# Patient Record
Sex: Female | Born: 1980 | Race: Black or African American | Hispanic: No | Marital: Single | Smoking: Former smoker
Health system: Southern US, Community
[De-identification: ages and names within clinical notes are randomized; demographics above are authoritative.]

## PROBLEM LIST (undated history)

## (undated) ENCOUNTER — Inpatient Hospital Stay (HOSPITAL_COMMUNITY): Payer: Self-pay

## (undated) DIAGNOSIS — I10 Essential (primary) hypertension: Secondary | ICD-10-CM

## (undated) DIAGNOSIS — Z8619 Personal history of other infectious and parasitic diseases: Secondary | ICD-10-CM

## (undated) DIAGNOSIS — Z3400 Encounter for supervision of normal first pregnancy, unspecified trimester: Secondary | ICD-10-CM

## (undated) DIAGNOSIS — B999 Unspecified infectious disease: Secondary | ICD-10-CM

## (undated) HISTORY — PX: INNER EAR SURGERY: SHX679

## (undated) HISTORY — DX: Personal history of other infectious and parasitic diseases: Z86.19

---

## 1999-06-06 ENCOUNTER — Emergency Department (HOSPITAL_COMMUNITY): Admission: EM | Admit: 1999-06-06 | Discharge: 1999-06-06 | Payer: Self-pay

## 1999-10-03 ENCOUNTER — Emergency Department (HOSPITAL_COMMUNITY): Admission: EM | Admit: 1999-10-03 | Discharge: 1999-10-03 | Payer: Self-pay | Admitting: Emergency Medicine

## 2000-04-18 ENCOUNTER — Emergency Department (HOSPITAL_COMMUNITY): Admission: EM | Admit: 2000-04-18 | Discharge: 2000-04-18 | Payer: Self-pay | Admitting: *Deleted

## 2006-08-28 ENCOUNTER — Emergency Department (HOSPITAL_COMMUNITY): Admission: EM | Admit: 2006-08-28 | Discharge: 2006-08-28 | Payer: Self-pay | Admitting: Emergency Medicine

## 2007-01-30 ENCOUNTER — Encounter: Admission: RE | Admit: 2007-01-30 | Discharge: 2007-01-30 | Payer: Self-pay | Admitting: Internal Medicine

## 2010-01-23 ENCOUNTER — Encounter: Payer: Self-pay | Admitting: Obstetrics & Gynecology

## 2010-01-23 ENCOUNTER — Encounter: Payer: Self-pay | Admitting: *Deleted

## 2010-04-16 ENCOUNTER — Emergency Department (HOSPITAL_COMMUNITY)
Admission: EM | Admit: 2010-04-16 | Discharge: 2010-04-16 | Disposition: A | Discharge status: Home / Self Care | Payer: Self-pay | Attending: Emergency Medicine | Admitting: Emergency Medicine

## 2010-04-16 ENCOUNTER — Emergency Department (HOSPITAL_COMMUNITY): Payer: Self-pay

## 2010-04-16 DIAGNOSIS — M7989 Other specified soft tissue disorders: Secondary | ICD-10-CM | POA: Insufficient documentation

## 2010-04-16 DIAGNOSIS — M79609 Pain in unspecified limb: Secondary | ICD-10-CM | POA: Insufficient documentation

## 2011-08-04 LAB — OB RESULTS CONSOLE ABO/RH: RH Type: POSITIVE

## 2011-08-04 LAB — OB RESULTS CONSOLE HEPATITIS B SURFACE ANTIGEN: Hepatitis B Surface Ag: NEGATIVE

## 2012-01-03 NOTE — L&D Delivery Note (Signed)
Delivery Note After 1 1/2 hr of pushing, with questionable and inconsistent  effort for delivery.  At 10:47 AM a viable and healthy female was delivered via Vaginal, Spontaneous Delivery (Presentation: LOA ; LOT).  APGAR: 8, 9 ; weight P.   Placenta status: delivered, intact .  Cord: 3VC with the following complications: none.   Anesthesia: Epidural  Episiotomy: none Lacerations: labial/periurethral on right Suture Repair: 3.0 vicryl rapide Est. Blood Loss (mL): 400cc  Mom to postpartum.  Baby to stay with mom and extended family.  BOVARD,Demont Linford 01/25/2012, 11:02 AM  O+/ Br/ RNI/ Contra - POPs

## 2012-01-07 ENCOUNTER — Encounter (HOSPITAL_COMMUNITY): Payer: Self-pay | Admitting: *Deleted

## 2012-01-07 ENCOUNTER — Inpatient Hospital Stay (HOSPITAL_COMMUNITY)
Admission: AD | Admit: 2012-01-07 | Discharge: 2012-01-07 | Disposition: A | Discharge status: Home / Self Care | Payer: Medicaid Other | Source: Ambulatory Visit | Attending: Obstetrics and Gynecology | Admitting: Obstetrics and Gynecology

## 2012-01-07 DIAGNOSIS — O99891 Other specified diseases and conditions complicating pregnancy: Secondary | ICD-10-CM | POA: Insufficient documentation

## 2012-01-07 LAB — POCT FERN TEST: POCT Fern Test: NEGATIVE

## 2012-01-07 NOTE — Progress Notes (Signed)
Dr Jackelyn Knife notified of pt's complaints of leakage of fluid  And fern results, orders received

## 2012-01-07 NOTE — Discharge Instructions (Signed)
Pregnancy - Third Trimester  The third trimester of pregnancy (the last 3 months) is a period of the most rapid growth for you and your baby. The baby approaches a length of 20 inches and a weight of 6 to 10 pounds. The baby is adding on fat and getting ready for life outside your body. While inside, babies have periods of sleeping and waking, suck their thumbs, and hiccups. You can often feel small contractions of the uterus. This is false labor. It is also called Braxton-Hicks contractions. This is like a practice for labor. The usual problems in this stage of pregnancy include more difficulty breathing, swelling of the hands and feet from water retention, and having to urinate more often because of the uterus and baby pressing on your bladder.   PRENATAL EXAMS  · Blood work may continue to be done during prenatal exams. These tests are done to check on your health and the probable health of your baby. Blood work is used to follow your blood levels (hemoglobin). Anemia (low hemoglobin) is common during pregnancy. Iron and vitamins are given to help prevent this. You may also continue to be checked for diabetes. Some of the past blood tests may be done again.  · The size of the uterus is measured during each visit. This makes sure your baby is growing properly according to your pregnancy dates.  · Your blood pressure is checked every prenatal visit. This is to make sure you are not getting toxemia.  · Your urine is checked every prenatal visit for infection, diabetes and protein.  · Your weight is checked at each visit. This is done to make sure gains are happening at the suggested rate and that you and your baby are growing normally.  · Sometimes, an ultrasound is performed to confirm the position and the proper growth and development of the baby. This is a test done that bounces harmless sound waves off the baby so your caregiver can more accurately determine due dates.  · Discuss the type of pain medication and  anesthesia you will have during your labor and delivery.  · Discuss the possibility and anesthesia if a Cesarean Section might be necessary.  · Inform your caregiver if there is any mental or physical violence at home.  Sometimes, a specialized non-stress test, contraction stress test and biophysical profile are done to make sure the baby is not having a problem. Checking the amniotic fluid surrounding the baby is called an amniocentesis. The amniotic fluid is removed by sticking a needle into the belly (abdomen). This is sometimes done near the end of pregnancy if an early delivery is required. In this case, it is done to help make sure the baby's lungs are mature enough for the baby to live outside of the womb. If the lungs are not mature and it is unsafe to deliver the baby, an injection of cortisone medication is given to the mother 1 to 2 days before the delivery. This helps the baby's lungs mature and makes it safer to deliver the baby.  CHANGES OCCURING IN THE THIRD TRIMESTER OF PREGNANCY  Your body goes through many changes during pregnancy. They vary from person to person. Talk to your caregiver about changes you notice and are concerned about.  · During the last trimester, you have probably had an increase in your appetite. It is normal to have cravings for certain foods. This varies from person to person and pregnancy to pregnancy.  · You may begin to   get stretch marks on your hips, abdomen, and breasts. These are normal changes in the body during pregnancy. There are no exercises or medications to take which prevent this change.  · Constipation may be treated with a stool softener or adding bulk to your diet. Drinking lots of fluids, fiber in vegetables, fruits, and whole grains are helpful.  · Exercising is also helpful. If you have been very active up until your pregnancy, most of these activities can be continued during your pregnancy. If you have been less active, it is helpful to start an exercise  program such as walking. Consult your caregiver before starting exercise programs.  · Avoid all smoking, alcohol, un-prescribed drugs, herbs and "street drugs" during your pregnancy. These chemicals affect the formation and growth of the baby. Avoid chemicals throughout the pregnancy to ensure the delivery of a healthy infant.  · Backache, varicose veins and hemorrhoids may develop or get worse.  · You will tire more easily in the third trimester, which is normal.  · The baby's movements may be stronger and more often.  · You may become short of breath easily.  · Your belly button may stick out.  · A yellow discharge may leak from your breasts called colostrum.  · You may have a bloody mucus discharge. This usually occurs a few days to a week before labor begins.  HOME CARE INSTRUCTIONS   · Keep your caregiver's appointments. Follow your caregiver's instructions regarding medication use, exercise, and diet.  · During pregnancy, you are providing food for you and your baby. Continue to eat regular, well-balanced meals. Choose foods such as meat, fish, milk and other low fat dairy products, vegetables, fruits, and whole-grain breads and cereals. Your caregiver will tell you of the ideal weight gain.  · A physical sexual relationship may be continued throughout pregnancy if there are no other problems such as early (premature) leaking of amniotic fluid from the membranes, vaginal bleeding, or belly (abdominal) pain.  · Exercise regularly if there are no restrictions. Check with your caregiver if you are unsure of the safety of your exercises. Greater weight gain will occur in the last 2 trimesters of pregnancy. Exercising helps:  · Control your weight.  · Get you in shape for labor and delivery.  · You lose weight after you deliver.  · Rest a lot with legs elevated, or as needed for leg cramps or low back pain.  · Wear a good support or jogging bra for breast tenderness during pregnancy. This may help if worn during  sleep. Pads or tissues may be used in the bra if you are leaking colostrum.  · Do not use hot tubs, steam rooms, or saunas.  · Wear your seat belt when driving. This protects you and your baby if you are in an accident.  · Avoid raw meat, cat litter boxes and soil used by cats. These carry germs that can cause birth defects in the baby.  · It is easier to loose urine during pregnancy. Tightening up and strengthening the pelvic muscles will help with this problem. You can practice stopping your urination while you are going to the bathroom. These are the same muscles you need to strengthen. It is also the muscles you would use if you were trying to stop from passing gas. You can practice tightening these muscles up 10 times a set and repeating this about 3 times per day. Once you know what muscles to tighten up, do not perform these   exercises during urination. It is more likely to cause an infection by backing up the urine.  · Ask for help if you have financial, counseling or nutritional needs during pregnancy. Your caregiver will be able to offer counseling for these needs as well as refer you for other special needs.  · Make a list of emergency phone numbers and have them available.  · Plan on getting help from family or friends when you go home from the hospital.  · Make a trial run to the hospital.  · Take prenatal classes with the father to understand, practice and ask questions about the labor and delivery.  · Prepare the baby's room/nursery.  · Do not travel out of the city unless it is absolutely necessary and with the advice of your caregiver.  · Wear only low or no heal shoes to have better balance and prevent falling.  MEDICATIONS AND DRUG USE IN PREGNANCY  · Take prenatal vitamins as directed. The vitamin should contain 1 milligram of folic acid. Keep all vitamins out of reach of children. Only a couple vitamins or tablets containing iron may be fatal to a baby or young child when ingested.  · Avoid use  of all medications, including herbs, over-the-counter medications, not prescribed or suggested by your caregiver. Only take over-the-counter or prescription medicines for pain, discomfort, or fever as directed by your caregiver. Do not use aspirin, ibuprofen (Motrin®, Advil®, Nuprin®) or naproxen (Aleve®) unless OK'd by your caregiver.  · Let your caregiver also know about herbs you may be using.  · Alcohol is related to a number of birth defects. This includes fetal alcohol syndrome. All alcohol, in any form, should be avoided completely. Smoking will cause low birth rate and premature babies.  · Street/illegal drugs are very harmful to the baby. They are absolutely forbidden. A baby born to an addicted mother will be addicted at birth. The baby will go through the same withdrawal an adult does.  SEEK MEDICAL CARE IF:  You have any concerns or worries during your pregnancy. It is better to call with your questions if you feel they cannot wait, rather than worry about them.  DECISIONS ABOUT CIRCUMCISION  You may or may not know the sex of your baby. If you know your baby is a boy, it may be time to think about circumcision. Circumcision is the removal of the foreskin of the penis. This is the skin that covers the sensitive end of the penis. There is no proven medical need for this. Often this decision is made on what is popular at the time or based upon religious beliefs and social issues. You can discuss these issues with your caregiver or pediatrician.  SEEK IMMEDIATE MEDICAL CARE IF:   · An unexplained oral temperature above 102° F (38.9° C) develops, or as your caregiver suggests.  · You have leaking of fluid from the vagina (birth canal). If leaking membranes are suspected, take your temperature and tell your caregiver of this when you call.  · There is vaginal spotting, bleeding or passing clots. Tell your caregiver of the amount and how many pads are used.  · You develop a bad smelling vaginal discharge with  a change in the color from clear to white.  · You develop vomiting that lasts more than 24 hours.  · You develop chills or fever.  · You develop shortness of breath.  · You develop burning on urination.  · You loose more than 2 pounds of weight   or gain more than 2 pounds of weight or as suggested by your caregiver.  · You notice sudden swelling of your face, hands, and feet or legs.  · You develop belly (abdominal) pain. Round ligament discomfort is a common non-cancerous (benign) cause of abdominal pain in pregnancy. Your caregiver still must evaluate you.  · You develop a severe headache that does not go away.  · You develop visual problems, blurred or double vision.  · If you have not felt your baby move for more than 1 hour. If you think the baby is not moving as much as usual, eat something with sugar in it and lie down on your left side for an hour. The baby should move at least 4 to 5 times per hour. Call right away if your baby moves less than that.  · You fall, are in a car accident or any kind of trauma.  · There is mental or physical violence at home.  Document Released: 12/13/2000 Document Revised: 03/13/2011 Document Reviewed: 06/17/2008  Exit[employer]® Patient Information ©2013 [employer].

## 2012-01-07 NOTE — MAU Provider Note (Signed)
Speculum exam done for evaluation of ROM.  Client reports wet underwear upon awakening today and periodic leaking through the day.  Speculum exam:  No pooling, no leaking seen with valsalva.  Fern slide done but suspect it will be negative as client has small amount of creamy vaginal discharge but no evidence of ROM on speculum exam.

## 2012-01-07 NOTE — MAU Note (Signed)
Pt states that she noticed that her underwear was staying damp since this morning around 10am. Denies any bleeding but does state some cramping since she noticed the leakage this morning

## 2012-01-10 LAB — OB RESULTS CONSOLE GBS: GBS: NEGATIVE

## 2012-01-25 ENCOUNTER — Encounter (HOSPITAL_COMMUNITY): Payer: Self-pay | Admitting: Anesthesiology

## 2012-01-25 ENCOUNTER — Inpatient Hospital Stay (HOSPITAL_COMMUNITY): Payer: Medicaid Other | Admitting: Anesthesiology

## 2012-01-25 ENCOUNTER — Encounter (HOSPITAL_COMMUNITY): Payer: Self-pay

## 2012-01-25 ENCOUNTER — Inpatient Hospital Stay (HOSPITAL_COMMUNITY)
Admission: AD | Admit: 2012-01-25 | Discharge: 2012-01-27 | DRG: 775 | Disposition: A | Discharge status: Home / Self Care | Payer: Medicaid Other | Source: Ambulatory Visit | Attending: Obstetrics and Gynecology | Admitting: Obstetrics and Gynecology

## 2012-01-25 DIAGNOSIS — Z3400 Encounter for supervision of normal first pregnancy, unspecified trimester: Secondary | ICD-10-CM

## 2012-01-25 DIAGNOSIS — O4290 Premature rupture of membranes, unspecified as to length of time between rupture and onset of labor, unspecified weeks of gestation: Secondary | ICD-10-CM | POA: Diagnosis present

## 2012-01-25 DIAGNOSIS — O9902 Anemia complicating childbirth: Secondary | ICD-10-CM | POA: Diagnosis present

## 2012-01-25 DIAGNOSIS — D573 Sickle-cell trait: Secondary | ICD-10-CM | POA: Diagnosis present

## 2012-01-25 HISTORY — DX: Encounter for supervision of normal first pregnancy, unspecified trimester: Z34.00

## 2012-01-25 LAB — URINALYSIS, ROUTINE W REFLEX MICROSCOPIC
Ketones, ur: NEGATIVE mg/dL
Leukocytes, UA: NEGATIVE
Nitrite: NEGATIVE
Protein, ur: NEGATIVE mg/dL
Urobilinogen, UA: 0.2 mg/dL (ref 0.0–1.0)
pH: 7 (ref 5.0–8.0)

## 2012-01-25 LAB — URINE MICROSCOPIC-ADD ON

## 2012-01-25 LAB — ABO/RH: ABO/RH(D): O POS

## 2012-01-25 LAB — COMPREHENSIVE METABOLIC PANEL
Albumin: 2.6 g/dL — ABNORMAL LOW (ref 3.5–5.2)
BUN: 5 mg/dL — ABNORMAL LOW (ref 6–23)
Calcium: 8.7 mg/dL (ref 8.4–10.5)
Creatinine, Ser: 0.72 mg/dL (ref 0.50–1.10)
GFR calc Af Amer: >90 mL/min (ref >90)
Glucose, Bld: 81 mg/dL (ref 70–99)
Total Protein: 6.5 g/dL (ref 6.0–8.3)

## 2012-01-25 LAB — TYPE AND SCREEN: ABO/RH(D): O POS

## 2012-01-25 LAB — CBC
Hemoglobin: 10.6 g/dL — ABNORMAL LOW (ref 12.0–15.0)
MCHC: 32.3 g/dL (ref 30.0–36.0)
Platelets: 200 10*3/uL (ref 150–400)
RDW: 13.4 % (ref 11.5–15.5)

## 2012-01-25 LAB — LACTATE DEHYDROGENASE: LDH: 196 U/L (ref 94–250)

## 2012-01-25 LAB — RPR: RPR Ser Ql: NONREACTIVE

## 2012-01-25 LAB — POCT FERN TEST: POCT Fern Test: POSITIVE

## 2012-01-25 MED ORDER — FLEET ENEMA 7-19 GM/118ML RE ENEM: 1.0000 | ENEMA | RECTAL | Status: DC | PRN

## 2012-01-25 MED ORDER — PHENYLEPHRINE 40 MCG/ML (10ML) SYRINGE FOR IV PUSH (FOR BLOOD PRESSURE SUPPORT)
80.0000 ug | PREFILLED_SYRINGE | INTRAVENOUS | Status: DC | PRN
  Filled 2012-01-25: qty 5

## 2012-01-25 MED ORDER — ONDANSETRON HCL 4 MG PO TABS: 4.0000 mg | ORAL_TABLET | ORAL | Status: DC | PRN

## 2012-01-25 MED ORDER — LIDOCAINE HCL (PF) 1 % IJ SOLN
INTRAMUSCULAR | Status: DC | PRN
  Administered 2012-01-25 (×2): 5 mL

## 2012-01-25 MED ORDER — IBUPROFEN 600 MG PO TABS
600.0000 mg | ORAL_TABLET | Freq: Four times a day (QID) | ORAL | Status: DC
  Administered 2012-01-25 – 2012-01-27 (×9): 600 mg via ORAL
  Filled 2012-01-25 (×9): qty 1

## 2012-01-25 MED ORDER — PRENATAL MULTIVITAMIN CH: 1.0000 | ORAL_TABLET | Freq: Every day | ORAL | Status: DC

## 2012-01-25 MED ORDER — ONDANSETRON HCL 4 MG/2ML IJ SOLN: 4.0000 mg | Freq: Four times a day (QID) | INTRAMUSCULAR | Status: DC | PRN

## 2012-01-25 MED ORDER — BENZOCAINE-MENTHOL 20-0.5 % EX AERO: 1.0000 "application " | INHALATION_SPRAY | CUTANEOUS | Status: DC | PRN

## 2012-01-25 MED ORDER — EPHEDRINE 5 MG/ML INJ: 10.0000 mg | INTRAVENOUS | Status: DC | PRN

## 2012-01-25 MED ORDER — ZOLPIDEM TARTRATE 5 MG PO TABS: 5.0000 mg | ORAL_TABLET | Freq: Every evening | ORAL | Status: DC | PRN

## 2012-01-25 MED ORDER — LACTATED RINGERS IV SOLN
INTRAVENOUS | Status: DC
  Administered 2012-01-25: 06:00:00 via INTRAVENOUS

## 2012-01-25 MED ORDER — WITCH HAZEL-GLYCERIN EX PADS: 1.0000 "application " | MEDICATED_PAD | CUTANEOUS | Status: DC | PRN

## 2012-01-25 MED ORDER — ACETAMINOPHEN 325 MG PO TABS: 650.0000 mg | ORAL_TABLET | ORAL | Status: DC | PRN

## 2012-01-25 MED ORDER — LANOLIN HYDROUS EX OINT: TOPICAL_OINTMENT | CUTANEOUS | Status: DC | PRN

## 2012-01-25 MED ORDER — OXYCODONE-ACETAMINOPHEN 5-325 MG PO TABS
1.0000 | ORAL_TABLET | ORAL | Status: DC | PRN
  Administered 2012-01-26: 1 via ORAL
  Filled 2012-01-25: qty 1

## 2012-01-25 MED ORDER — OXYCODONE-ACETAMINOPHEN 5-325 MG PO TABS: 1.0000 | ORAL_TABLET | ORAL | Status: DC | PRN

## 2012-01-25 MED ORDER — OXYTOCIN 40 UNITS IN LACTATED RINGERS INFUSION - SIMPLE MED: 1.0000 m[IU]/min | INTRAVENOUS | Status: DC

## 2012-01-25 MED ORDER — LACTATED RINGERS IV SOLN: 500.0000 mL | INTRAVENOUS | Status: DC | PRN

## 2012-01-25 MED ORDER — LACTATED RINGERS IV SOLN
500.0000 mL | Freq: Once | INTRAVENOUS | Status: AC
  Administered 2012-01-25: 500 mL via INTRAVENOUS

## 2012-01-25 MED ORDER — PHENYLEPHRINE 40 MCG/ML (10ML) SYRINGE FOR IV PUSH (FOR BLOOD PRESSURE SUPPORT): 80.0000 ug | PREFILLED_SYRINGE | INTRAVENOUS | Status: DC | PRN

## 2012-01-25 MED ORDER — OXYTOCIN 40 UNITS IN LACTATED RINGERS INFUSION - SIMPLE MED
62.5000 mL/h | INTRAVENOUS | Status: DC
  Administered 2012-01-25: 62.5 mL/h via INTRAVENOUS
  Filled 2012-01-25: qty 1000

## 2012-01-25 MED ORDER — TERBUTALINE SULFATE 1 MG/ML IJ SOLN: 0.2500 mg | Freq: Once | INTRAMUSCULAR | Status: DC | PRN

## 2012-01-25 MED ORDER — SENNOSIDES-DOCUSATE SODIUM 8.6-50 MG PO TABS
2.0000 | ORAL_TABLET | Freq: Every day | ORAL | Status: DC
  Administered 2012-01-25 – 2012-01-26 (×2): 2 via ORAL

## 2012-01-25 MED ORDER — CITRIC ACID-SODIUM CITRATE 334-500 MG/5ML PO SOLN: 30.0000 mL | ORAL | Status: DC | PRN

## 2012-01-25 MED ORDER — BUTORPHANOL TARTRATE 1 MG/ML IJ SOLN
1.0000 mg | Freq: Once | INTRAMUSCULAR | Status: AC
  Administered 2012-01-25: 1 mg via INTRAVENOUS
  Filled 2012-01-25: qty 1

## 2012-01-25 MED ORDER — OXYTOCIN BOLUS FROM INFUSION: 500.0000 mL | INTRAVENOUS | Status: DC

## 2012-01-25 MED ORDER — LACTATED RINGERS IV SOLN: INTRAVENOUS | Status: DC

## 2012-01-25 MED ORDER — DIPHENHYDRAMINE HCL 50 MG/ML IJ SOLN: 12.5000 mg | INTRAMUSCULAR | Status: DC | PRN

## 2012-01-25 MED ORDER — IBUPROFEN 600 MG PO TABS: 600.0000 mg | ORAL_TABLET | Freq: Four times a day (QID) | ORAL | Status: DC | PRN

## 2012-01-25 MED ORDER — TETANUS-DIPHTH-ACELL PERTUSSIS 5-2.5-18.5 LF-MCG/0.5 IM SUSP: 0.5000 mL | Freq: Once | INTRAMUSCULAR | Status: DC

## 2012-01-25 MED ORDER — EPHEDRINE 5 MG/ML INJ
10.0000 mg | INTRAVENOUS | Status: DC | PRN
  Filled 2012-01-25: qty 4

## 2012-01-25 MED ORDER — SIMETHICONE 80 MG PO CHEW: 80.0000 mg | CHEWABLE_TABLET | ORAL | Status: DC | PRN

## 2012-01-25 MED ORDER — ONDANSETRON HCL 4 MG/2ML IJ SOLN: 4.0000 mg | INTRAMUSCULAR | Status: DC | PRN

## 2012-01-25 MED ORDER — FENTANYL 2.5 MCG/ML BUPIVACAINE 1/10 % EPIDURAL INFUSION (WH - ANES)
14.0000 mL/h | INTRAMUSCULAR | Status: DC
  Administered 2012-01-25: 14 mL/h via EPIDURAL
  Filled 2012-01-25: qty 125

## 2012-01-25 MED ORDER — DIPHENHYDRAMINE HCL 25 MG PO CAPS: 25.0000 mg | ORAL_CAPSULE | Freq: Four times a day (QID) | ORAL | Status: DC | PRN

## 2012-01-25 MED ORDER — PRENATAL MULTIVITAMIN CH
1.0000 | ORAL_TABLET | Freq: Every day | ORAL | Status: DC
  Administered 2012-01-25 – 2012-01-27 (×3): 1 via ORAL
  Filled 2012-01-25 (×3): qty 1

## 2012-01-25 MED ORDER — LIDOCAINE HCL (PF) 1 % IJ SOLN
30.0000 mL | INTRAMUSCULAR | Status: DC | PRN
  Filled 2012-01-25: qty 30

## 2012-01-25 MED ORDER — DIBUCAINE 1 % RE OINT: 1.0000 "application " | TOPICAL_OINTMENT | RECTAL | Status: DC | PRN

## 2012-01-25 NOTE — MAU Note (Signed)
Pt reports ROM at 0145, some discomfort

## 2012-01-25 NOTE — Progress Notes (Signed)
Patient ID: Diana Flynn, female   DOB: [birth year], 32 y.o.   MRN: [id number]  CTSP secondary to 5 min decel.  Pt comfortable with epidural  AFVSS gen NAD FHR 125, min/mod var, had variability during decel toco irr  Pit off, will let baby rest and restart slowly after 30 min break Rapid cervical change 5 - 7 cm  Continue close monitoring

## 2012-01-25 NOTE — H&P (Signed)
Diana Flynn, Diana Flynn NO.:  [id number]  MEDICAL RECORD NO.:  [id number]  LOCATION:  9165                          FACILITY:  [facility]  PHYSICIAN:  Malachi Pro. Ambrose Mantle, M.D. DATE OF BIRTH:  [birth year]  DATE OF ADMISSION:  01/25/2012 DATE OF DISCHARGE:                             HISTORY & PHYSICAL   PRESENT ILLNESS:  This is a 32 year old black female, para 0-0-2-0, gravida 3 with [facility] February 01, 2012 who is admitted with premature rupture of the membranes.  The rupture of membranes occurred at 1:45 a.m. on January 25, 2012.  The patient got up to go to the restroom and noted fluid running down her leg and was convinced that it was not urine.  She came to the emergency room, was found to have ruptured membranes and was admitted.  Blood group and type O positive.  Negative antibody.  Pap smear normal.  Rubella equivocal.  RPR nonreactive. Urine culture negative.  Hepatitis B surface antigen negative.  HIV negative.  Urine drug screen negative.  Hemoglobin electrophoresis AS. GC and Chlamydia negative.  Cystic fibrosis screen negative.  First trimester screen negative.  AFP negative.  One hour Glucola 96 group B strep negative.  The patient's prenatal course was essentially uncomplicated.  She did have a history of cocaine use.  She has sickle cell trait.  Father of baby states he is not a carrier and the patient smoked, she did receive the Tdap and the flu vaccine and she is group B strep negative.  She did have some difficulty with vision in her left eye at 29 weeks.  This apparently resolved.  Her blood pressure was always normal at her prenatal visits, but they were high-normal on January 8th and January 16th at 128/88.  While in the maternity admission unit, her blood pressure was slightly elevated.  PIH labs were drawn which were normal.  The urine specimen has not been returned. While in the maternity admission unit, she was noted to have a deceleration that lasted  for 4 minutes, but with proper positioning the heart rate returned to normal and has remained normal.  PAST MEDICAL HISTORY:  Reveals that she has had 2 therapeutic abortions. She has had a history of Chlamydia and gonorrhea.  SURGICAL HISTORY:  She has had surgical procedures, two terminations of pregnancy.  ALLERGIES:  She has no known drug allergies.  No latex allergies.  FAMILY HISTORY:  Mom has diabetes.  No other close family history.  OBSTETRIC HISTORY:  Two terminations of pregnancy.  The patient states that since she has been pregnant she has not used alcohol or cocaine. She was smoking cigarettes at the time of the onset of pregnancy.  PHYSICAL EXAMINATION:  VITAL SIGNS:  On admission, her blood pressure is 127/88, temperature 98.4, heart rate 89, pulse 18. HEART:  Normal size and sounds.  No murmurs. LUNGS:  Clear to auscultation. ABDOMEN:  Soft, nontender.  Appropriate for gestational age. PELVIC:  Per the admitting nurse the cervix was 2 cm, 70%, vertex at -2. Ruptured membranes was confirmed.  ADMITTING IMPRESSION:  Intrauterine pregnancy at 39 weeks with premature rupture of membranes, history of cocaine abuse.  The patient has been started on Pitocin.  She is not contracting regularly, but the fetal heart rate has remained normal.  She will be watched closely and observed for progress of labor.     Malachi Pro. Ambrose Mantle, M.D.     TFH/MEDQ  D:  01/25/2012  T:  01/25/2012  Job:  [id number]

## 2012-01-25 NOTE — Anesthesia Preprocedure Evaluation (Signed)

## 2012-01-25 NOTE — MAU Note (Signed)
FHR down into 80's for 4 minutes. IV started, labs drawn. 10L O2 started and patient turned on left side. FHR back up to 130's

## 2012-01-25 NOTE — Anesthesia Procedure Notes (Signed)
Epidural Patient location during procedure: OB Start time: 01/25/2012 7:43 AM  Staffing Anesthesiologist: Angus Seller., Harrell Gave. Performed by: anesthesiologist   Preanesthetic Checklist Completed: patient identified, site marked, surgical consent, pre-op evaluation, timeout performed, IV checked, risks and benefits discussed and monitors and equipment checked  Epidural Patient position: sitting Prep: site prepped and draped and DuraPrep Patient monitoring: continuous pulse ox and blood pressure Approach: midline Injection technique: LOR air and LOR saline  Needle:  Needle type: Tuohy  Needle gauge: 17 G Needle length: 9 cm and 9 Needle insertion depth: 5 cm cm Catheter type: closed end flexible Catheter size: 19 Gauge Catheter at skin depth: 10 cm Test dose: negative  Assessment Events: blood not aspirated, injection not painful, no injection resistance, negative IV test and no paresthesia  Additional Notes Patient identified.  Risk benefits discussed including failed block, incomplete pain control, headache, nerve damage, paralysis, blood pressure changes, nausea, vomiting, reactions to medication both toxic or allergic, and postpartum back pain.  Patient expressed understanding and wished to proceed.  All questions were answered.  Sterile technique used throughout procedure and epidural site dressed with sterile barrier dressing. No paresthesia or other complications noted.The patient did not experience any signs of intravascular injection such as tinnitus or metallic taste in mouth nor signs of intrathecal spread such as rapid motor block. Please see nursing notes for vital signs.

## 2012-01-26 LAB — CBC
MCHC: 31.8 g/dL (ref 30.0–36.0)
Platelets: 183 10*3/uL (ref 150–400)
RDW: 13.3 % (ref 11.5–15.5)
WBC: 8.2 10*3/uL (ref 4.0–10.5)

## 2012-01-26 NOTE — Anesthesia Postprocedure Evaluation (Signed)
  Anesthesia Post-op Note  Patient: Diana Flynn  Procedure(s) Performed: * No procedures listed *  Patient Location: Mother/Baby  Anesthesia Type:Epidural  Level of Consciousness: awake  Airway and Oxygen Therapy: Patient Spontanous Breathing  Post-op Pain: none  Post-op Assessment: Patient's Cardiovascular Status Stable, Respiratory Function Stable, Patent Airway, No signs of Nausea or vomiting, Adequate PO intake, Pain level controlled, No headache, No backache, No residual numbness and No residual motor weakness  Post-op Vital Signs: Reviewed and stable  Complications: No apparent anesthesia complications

## 2012-01-26 NOTE — Discharge Summary (Signed)
Obstetric Discharge Summary Reason for Admission: rupture of membranes Prenatal Procedures: none Intrapartum Procedures: spontaneous vaginal delivery Postpartum Procedures: none Complications-Operative and Postpartum: first  degree perineal laceration Hemoglobin  Date Value Range Status  01/26/2012 8.7* 12.0 - 15.0 g/dL Final     DELTA CHECK NOTED     REPEATED TO VERIFY     HCT  Date Value Range Status  01/26/2012 27.4* 36.0 - 46.0 % Final    Physical Exam:  General: alert and cooperative Lochia: appropriate Uterine Fundus: firm   Discharge Diagnoses: Term Pregnancy-delivered  Discharge Information: Date: 01/26/2012 Activity: pelvic rest Diet: routine Medications: Ibuprofen and Percocet Condition: stable Instructions: refer to practice specific booklet Discharge to: home   Newborn Data: Live born female  Birth Weight: 6 lb 14.2 oz (3125 g) APGAR: 8, 9  Home with mother.  Oliver Pila 01/26/2012, 11:14 PM

## 2012-01-26 NOTE — Clinical Social Work Maternal (Signed)
    Clinical Social Work Department PSYCHOSOCIAL ASSESSMENT - MATERNAL/CHILD 01/26/2012  Patient:  Diana Flynn, Diana Flynn  Account Number:  [id number]  Admit Date:  01/25/2012  Marjo Bicker Name:   Patsy Lager    Clinical Social Worker:  Nobie Putnam, LCSW   Date/Time:  01/26/2012 12:00 N  Date Referred:  01/26/2012   Referral source  CN     Referred reason  Substance Abuse   Other referral source:    I:  FAMILY / HOME ENVIRONMENT Child's legal guardian:  PARENT  Guardian - Name Guardian - Age Guardian - Address  Valeda Corzine [address] [place], [place] [address]  Not disclosed     Other household support members/support persons Name Relationship DOB   GRAND MOTHER    Other support:   Pamella Pert, mother    II  PSYCHOSOCIAL DATA Information Source:  Patient Interview  Financial and Community Resources Employment:   Financial resources:   If Medicaid - County:    School / Grade:   Maternity Care Coordinator / Child Services Coordination / Early Interventions:  Cultural issues impacting care:    III  STRENGTHS Strengths  Adequate Resources  Home prepared for Child (including basic supplies)  Supportive family/friends   Strength comment:    IV  RISK FACTORS AND CURRENT PROBLEMS Current Problem:  None   Risk Factor & Current Problem Patient Issue Family Issue Risk Factor / Current Problem Comment   N N     V  SOCIAL WORK ASSESSMENT CSW met with pt to assess history of cocaine use.  Pt was visibly upset when this CSW inquired about substance use history.  While she did not deny the cocaine use, she told CSW that she has not used in 5-6 years.  Pt told CSW that she was asked a general question (by staff at physicians office) about substance use & she was honest.  She denies any other illegal substance use and verbalized understanding of hospital drug testing policy.  Pt is confident that the results will be negative and does not appear to be concerned.  UDS is  negative, meconium results are pending.  She has all the necessary supplies for the infant.  She identified her mother, as her primary support person.  She is not sure if [name] will be involved & did not wish to discuss him.  CSW will continue to monitor drug screen results & make a referral if needed.      VI SOCIAL WORK PLAN Social Work Plan  No Further Intervention Required / No Barriers to Discharge   Type of pt/family education:   If child protective services report - county:   If child protective services report - date:   Information/referral to community resources comment:   Other social work plan:

## 2012-01-26 NOTE — Progress Notes (Signed)
Post Partum Day 1 Subjective: no complaints, up ad lib, voiding, tolerating PO and nl lochia, pain controlled  Objective: Blood pressure 143/84, pulse 80, temperature 97.7 F (36.5 C), temperature source Oral, resp. rate 18, height 5\' 5"  (1.651 m), weight 89.359 kg (197 lb), last menstrual period 04/27/2011, SpO2 100.00%, unknown if currently breastfeeding.  Physical Exam:  General: alert and no distress Lochia: appropriate Uterine Fundus: firm   Basename 01/26/12 0505 01/25/12 0412  HGB 8.7* 10.6*  HCT 27.4* 32.8*    Assessment/Plan: Plan for discharge tomorrow, Breastfeeding and Lactation consult.  Routine care.  Doing well.     LOS: 1 day   BOVARD,Daleiza Bacchi 01/26/2012, 9:53 AM

## 2012-01-27 MED ORDER — IBUPROFEN 600 MG PO TABS: 600.0000 mg | ORAL_TABLET | Freq: Four times a day (QID) | ORAL | Status: DC

## 2012-01-27 MED ORDER — PRENATAL MULTIVITAMIN CH: 1.0000 | ORAL_TABLET | Freq: Every day | ORAL | Status: DC

## 2012-01-27 MED ORDER — MEASLES, MUMPS & RUBELLA VAC ~~LOC~~ INJ
0.5000 mL | INJECTION | Freq: Once | SUBCUTANEOUS | Status: AC
  Administered 2012-01-27: 0.5 mL via SUBCUTANEOUS
  Filled 2012-01-27: qty 0.5

## 2012-01-27 MED ORDER — OXYCODONE-ACETAMINOPHEN 5-325 MG PO TABS: 1.0000 | ORAL_TABLET | ORAL | Status: DC | PRN

## 2012-01-27 NOTE — Progress Notes (Signed)
Post Partum Day 2 Subjective: no complaints, up ad lib and tolerating PO  Objective: Blood pressure 127/86, pulse 84, temperature 98 F (36.7 C), temperature source Oral, resp. rate 18, height 5\' 5"  (1.651 m), weight 89.359 kg (197 lb), last menstrual period 04/27/2011, SpO2 100.00%, unknown if currently breastfeeding.  Physical Exam:  General: alert and cooperative Lochia: appropriate Uterine Fundus: firm   Basename 01/26/12 0505 01/25/12 0412  HGB 8.7* 10.6*  HCT 27.4* 32.8*    Assessment/Plan: Discharge home Motrin and percocet f/u 6 weeks   LOS: 2 days   Diana Flynn W 01/27/2012, 11:30 AM

## 2012-06-09 ENCOUNTER — Encounter (HOSPITAL_COMMUNITY): Payer: Self-pay | Admitting: Emergency Medicine

## 2012-06-09 ENCOUNTER — Emergency Department (HOSPITAL_COMMUNITY)
Admission: EM | Admit: 2012-06-09 | Discharge: 2012-06-09 | Disposition: A | Discharge status: Home / Self Care | Payer: Self-pay | Attending: Emergency Medicine | Admitting: Emergency Medicine

## 2012-06-09 DIAGNOSIS — Z87891 Personal history of nicotine dependence: Secondary | ICD-10-CM | POA: Insufficient documentation

## 2012-06-09 DIAGNOSIS — Z23 Encounter for immunization: Secondary | ICD-10-CM | POA: Insufficient documentation

## 2012-06-09 DIAGNOSIS — S0181XA Laceration without foreign body of other part of head, initial encounter: Secondary | ICD-10-CM

## 2012-06-09 DIAGNOSIS — Y929 Unspecified place or not applicable: Secondary | ICD-10-CM | POA: Insufficient documentation

## 2012-06-09 DIAGNOSIS — Y9389 Activity, other specified: Secondary | ICD-10-CM | POA: Insufficient documentation

## 2012-06-09 DIAGNOSIS — S0180XA Unspecified open wound of other part of head, initial encounter: Secondary | ICD-10-CM | POA: Insufficient documentation

## 2012-06-09 DIAGNOSIS — W19XXXA Unspecified fall, initial encounter: Secondary | ICD-10-CM | POA: Insufficient documentation

## 2012-06-09 MED ORDER — HYDROCODONE-ACETAMINOPHEN 5-325 MG PO TABS
2.0000 | ORAL_TABLET | Freq: Once | ORAL | Status: AC
  Administered 2012-06-09: 2 via ORAL
  Filled 2012-06-09: qty 2

## 2012-06-09 MED ORDER — TETANUS-DIPHTH-ACELL PERTUSSIS 5-2.5-18.5 LF-MCG/0.5 IM SUSP
0.5000 mL | Freq: Once | INTRAMUSCULAR | Status: AC
  Administered 2012-06-09: 0.5 mL via INTRAMUSCULAR
  Filled 2012-06-09: qty 0.5
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
## 2012-06-10 NOTE — ED Provider Notes (Signed)
Medical screening examination/treatment/procedure(s) were performed by non-physician practitioner and as supervising physician I was immediately available for consultation/collaboration.  Gerhard Munch, MD 06/10/12 [phone]

## 2013-11-03 ENCOUNTER — Encounter (HOSPITAL_COMMUNITY): Payer: Self-pay | Admitting: Emergency Medicine

## 2015-07-18 ENCOUNTER — Inpatient Hospital Stay (HOSPITAL_COMMUNITY): Payer: Medicaid Other

## 2015-07-18 ENCOUNTER — Inpatient Hospital Stay (HOSPITAL_COMMUNITY)
Admission: AD | Admit: 2015-07-18 | Discharge: 2015-07-18 | Disposition: A | Discharge status: Home / Self Care | Payer: Medicaid Other | Source: Ambulatory Visit | Attending: Obstetrics & Gynecology | Admitting: Obstetrics & Gynecology

## 2015-07-18 ENCOUNTER — Encounter (HOSPITAL_COMMUNITY): Payer: Self-pay | Admitting: *Deleted

## 2015-07-18 DIAGNOSIS — Z3A00 Weeks of gestation of pregnancy not specified: Secondary | ICD-10-CM | POA: Diagnosis not present

## 2015-07-18 DIAGNOSIS — Z3490 Encounter for supervision of normal pregnancy, unspecified, unspecified trimester: Secondary | ICD-10-CM

## 2015-07-18 DIAGNOSIS — O219 Vomiting of pregnancy, unspecified: Secondary | ICD-10-CM | POA: Diagnosis not present

## 2015-07-18 DIAGNOSIS — N949 Unspecified condition associated with female genital organs and menstrual cycle: Secondary | ICD-10-CM

## 2015-07-18 DIAGNOSIS — R102 Pelvic and perineal pain: Secondary | ICD-10-CM | POA: Diagnosis present

## 2015-07-18 DIAGNOSIS — Z87891 Personal history of nicotine dependence: Secondary | ICD-10-CM | POA: Insufficient documentation

## 2015-07-18 DIAGNOSIS — O26891 Other specified pregnancy related conditions, first trimester: Secondary | ICD-10-CM | POA: Diagnosis not present

## 2015-07-18 HISTORY — DX: Unspecified infectious disease: B99.9

## 2015-07-18 LAB — HCG, QUANTITATIVE, PREGNANCY: hCG, Beta Chain, Quant, S: 34879 m[IU]/mL — ABNORMAL HIGH (ref <5)

## 2015-07-18 LAB — URINALYSIS, ROUTINE W REFLEX MICROSCOPIC
Bilirubin Urine: NEGATIVE
GLUCOSE, UA: NEGATIVE mg/dL
Hgb urine dipstick: NEGATIVE
Ketones, ur: NEGATIVE mg/dL
LEUKOCYTES UA: NEGATIVE
NITRITE: NEGATIVE
PH: 6.5 (ref 5.0–8.0)
Protein, ur: NEGATIVE mg/dL
SPECIFIC GRAVITY, URINE: 1.01 (ref 1.005–1.030)

## 2015-07-18 LAB — CBC
HCT: 34.7 % — ABNORMAL LOW (ref 36.0–46.0)
Hemoglobin: 11.3 g/dL — ABNORMAL LOW (ref 12.0–15.0)
MCH: 22.9 pg — AB (ref 26.0–34.0)
MCHC: 32.6 g/dL (ref 30.0–36.0)
MCV: 70.2 fL — AB (ref 78.0–100.0)
PLATELETS: 225 10*3/uL (ref 150–400)
RBC: 4.94 MIL/uL (ref 3.87–5.11)
RDW: 13.4 % (ref 11.5–15.5)
WBC: 4.6 10*3/uL (ref 4.0–10.5)

## 2015-07-18 LAB — WET PREP, GENITAL
CLUE CELLS WET PREP: NONE SEEN
SPERM: NONE SEEN
Trich, Wet Prep: NONE SEEN
Yeast Wet Prep HPF POC: NONE SEEN

## 2015-07-18 LAB — POCT PREGNANCY, URINE: Preg Test, Ur: POSITIVE — AB

## 2015-07-18 MED ORDER — PROMETHAZINE HCL 25 MG PO TABS: 12.5000 mg | ORAL_TABLET | Freq: Four times a day (QID) | ORAL | Status: DC | PRN

## 2015-07-18 NOTE — Discharge Instructions (Signed)
First Trimester of Pregnancy °The first trimester of pregnancy is from week 1 until the end of week 12 (months 1 through 3). A week after a sperm fertilizes an egg, the egg will implant on the wall of the uterus. This embryo will begin to develop into a baby. Genes from you and your partner are forming the baby. The female genes determine whether the baby is a boy or a girl. At 6-8 weeks, the eyes and face are formed, and the heartbeat can be seen on ultrasound. At the end of 12 weeks, all the baby's organs are formed.  °Now that you are pregnant, you will want to do everything you can to have a healthy baby. Two of the most important things are to get good prenatal care and to follow your health care provider's instructions. Prenatal care is all the medical care you receive before the baby's birth. This care will help prevent, find, and treat any problems during the pregnancy and childbirth. °BODY CHANGES °Your body goes through many changes during pregnancy. The changes vary from woman to woman.  °· You may gain or lose a couple of pounds at first. °· You may feel sick to your stomach (nauseous) and throw up (vomit). If the vomiting is uncontrollable, call your health care provider. °· You may tire easily. °· You may develop headaches that can be relieved by medicines approved by your health care provider. °· You may urinate more often. Painful urination may mean you have a bladder infection. °· You may develop heartburn as a result of your pregnancy. °· You may develop constipation because certain hormones are causing the muscles that push waste through your intestines to slow down. °· You may develop hemorrhoids or swollen, bulging veins (varicose veins). °· Your breasts may begin to grow larger and become tender. Your nipples may stick out more, and the tissue that surrounds them (areola) may become darker. °· Your gums may bleed and may be sensitive to brushing and flossing. °· Dark spots or blotches (chloasma,  mask of pregnancy) may develop on your face. This will likely fade after the baby is born. °· Your menstrual periods will stop. °· You may have a loss of appetite. °· You may develop cravings for certain kinds of food. °· You may have changes in your emotions from day to day, such as being excited to be pregnant or being concerned that something may go wrong with the pregnancy and baby. °· You may have more vivid and strange dreams. °· You may have changes in your hair. These can include thickening of your hair, rapid growth, and changes in texture. Some women also have hair loss during or after pregnancy, or hair that feels dry or thin. Your hair will most likely return to normal after your baby is born. °WHAT TO EXPECT AT YOUR PRENATAL VISITS °During a routine prenatal visit: °· You will be weighed to make sure you and the baby are growing normally. °· Your blood pressure will be taken. °· Your abdomen will be measured to track your baby's growth. °· The fetal heartbeat will be listened to starting around week 10 or 12 of your pregnancy. °· Test results from any previous visits will be discussed. °Your health care provider may ask you: °· How you are feeling. °· If you are feeling the baby move. °· If you have had any abnormal symptoms, such as leaking fluid, bleeding, severe headaches, or abdominal cramping. °· If you are using any tobacco products,   including cigarettes, chewing tobacco, and electronic cigarettes. °· If you have any questions. °Other tests that may be performed during your first trimester include: °· Blood tests to find your blood type and to check for the presence of any previous infections. They will also be used to check for low iron levels (anemia) and Rh antibodies. Later in the pregnancy, blood tests for diabetes will be done along with other tests if problems develop. °· Urine tests to check for infections, diabetes, or protein in the urine. °· An ultrasound to confirm the proper growth  and development of the baby. °· An amniocentesis to check for possible genetic problems. °· Fetal screens for spina bifida and Down syndrome. °· You may need other tests to make sure you and the baby are doing well. °· HIV (human immunodeficiency virus) testing. Routine prenatal testing includes screening for HIV, unless you choose not to have this test. °HOME CARE INSTRUCTIONS  °Medicines °· Follow your health care provider's instructions regarding medicine use. Specific medicines may be either safe or unsafe to take during pregnancy. °· Take your prenatal vitamins as directed. °· If you develop constipation, try taking a stool softener if your health care provider approves. °Diet °· Eat regular, well-balanced meals. Choose a variety of foods, such as meat or vegetable-based protein, fish, milk and low-fat dairy products, vegetables, fruits, and whole grain breads and cereals. Your health care provider will help you determine the amount of weight gain that is right for you. °· Avoid raw meat and uncooked cheese. These carry germs that can cause birth defects in the baby. °· Eating four or five small meals rather than three large meals a day may help relieve nausea and vomiting. If you start to feel nauseous, eating a few soda crackers can be helpful. Drinking liquids between meals instead of during meals also seems to help nausea and vomiting. °· If you develop constipation, eat more high-fiber foods, such as fresh vegetables or fruit and whole grains. Drink enough fluids to keep your urine clear or pale yellow. °Activity and Exercise °· Exercise only as directed by your health care provider. Exercising will help you: °¨ Control your weight. °¨ Stay in shape. °¨ Be prepared for labor and delivery. °· Experiencing pain or cramping in the lower abdomen or low back is a good sign that you should stop exercising. Check with your health care provider before continuing normal exercises. °· Try to avoid standing for long  periods of time. Move your legs often if you must stand in one place for a long time. °· Avoid heavy lifting. °· Wear low-heeled shoes, and practice good posture. °· You may continue to have sex unless your health care provider directs you otherwise. °Relief of Pain or Discomfort °· Wear a good support bra for breast tenderness.   °· Take warm sitz baths to soothe any pain or discomfort caused by hemorrhoids. Use hemorrhoid cream if your health care provider approves.   °· Rest with your legs elevated if you have leg cramps or low back pain. °· If you develop varicose veins in your legs, wear support hose. Elevate your feet for 15 minutes, 3-4 times a day. Limit salt in your diet. °Prenatal Care °· Schedule your prenatal visits by the twelfth week of pregnancy. They are usually scheduled monthly at first, then more often in the last 2 months before delivery. °· Write down your questions. Take them to your prenatal visits. °· Keep all your prenatal visits as directed by your   health care provider. °Safety °· Wear your seat belt at all times when driving. °· Make a list of emergency phone numbers, including numbers for family, friends, the hospital, and police and fire departments. °General Tips °· Ask your health care provider for a referral to a local prenatal education class. Begin classes no later than at the beginning of month 6 of your pregnancy. °· Ask for help if you have counseling or nutritional needs during pregnancy. Your health care provider can offer advice or refer you to specialists for help with various needs. °· Do not use hot tubs, steam rooms, or saunas. °· Do not douche or use tampons or scented sanitary pads. °· Do not cross your legs for long periods of time. °· Avoid cat litter boxes and soil used by cats. These carry germs that can cause birth defects in the baby and possibly loss of the fetus by miscarriage or stillbirth. °· Avoid all smoking, herbs, alcohol, and medicines not prescribed by  your health care provider. Chemicals in these affect the formation and growth of the baby. °· Do not use any tobacco products, including cigarettes, chewing tobacco, and electronic cigarettes. If you need help quitting, ask your health care provider. You may receive counseling support and other resources to help you quit. °· Schedule a dentist appointment. At home, brush your teeth with a soft toothbrush and be gentle when you floss. °SEEK MEDICAL CARE IF:  °· You have dizziness. °· You have mild pelvic cramps, pelvic pressure, or nagging pain in the abdominal area. °· You have persistent nausea, vomiting, or diarrhea. °· You have a bad smelling vaginal discharge. °· You have pain with urination. °· You notice increased swelling in your face, hands, legs, or ankles. °SEEK IMMEDIATE MEDICAL CARE IF:  °· You have a fever. °· You are leaking fluid from your vagina. °· You have spotting or bleeding from your vagina. °· You have severe abdominal cramping or pain. °· You have rapid weight gain or loss. °· You vomit blood or material that looks like coffee grounds. °· You are exposed to German measles and have never had them. °· You are exposed to fifth disease or chickenpox. °· You develop a severe headache. °· You have shortness of breath. °· You have any kind of trauma, such as from a fall or a car accident. °  °This information is not intended to replace advice given to you by your health care provider. Make sure you discuss any questions you have with your health care provider. °  °Document Released: 12/13/2000 Document Revised: 01/09/2014 Document Reviewed: 10/29/2012 °[employer]vier Interactive Patient Education ©2016 [employer] ° ° °Morning Sickness °Morning sickness is when you feel sick to your stomach (nauseous) during pregnancy. This nauseous feeling may or may not come with vomiting. It often occurs in the morning but can be a problem any time of day. Morning sickness is most common during the first trimester,  but it may continue throughout pregnancy. While morning sickness is unpleasant, it is usually harmless unless you develop severe and continual vomiting (hyperemesis gravidarum). This condition requires more intense treatment.  °CAUSES  °The cause of morning sickness is not completely known but seems to be related to normal hormonal changes that occur in pregnancy. °RISK FACTORS °You are at greater risk if you: °· Experienced nausea or vomiting before your pregnancy. °· Had morning sickness during a previous pregnancy. °· Are pregnant with more than one baby, such as twins. °TREATMENT  °Do not use   any medicines (prescription, over-the-counter, or herbal) for morning sickness without first talking to your health care provider. Your health care provider may prescribe or recommend: °· Vitamin B6 supplements. °· Anti-nausea medicines. °· The herbal medicine ginger. °HOME CARE INSTRUCTIONS  °· Only take over-the-counter or prescription medicines as directed by your health care provider. °· Taking multivitamins before getting pregnant can prevent or decrease the severity of morning sickness in most women. °· Eat a piece of dry toast or unsalted crackers before getting out of bed in the morning. °· Eat five or six small meals a day. °· Eat dry and bland foods (rice, baked potato). Foods high in carbohydrates are often helpful. °· Do not drink liquids with your meals. Drink liquids between meals. °· Avoid greasy, fatty, and spicy foods. °· Get someone to cook for you if the smell of any food causes nausea and vomiting. °· If you feel nauseous after taking prenatal vitamins, take the vitamins at night or with a snack.  °· Snack on protein foods (nuts, yogurt, cheese) between meals if you are hungry. °· Eat unsweetened gelatins for desserts. °· Wearing an acupressure wristband (worn for sea sickness) may be helpful. °· Acupuncture may be helpful. °· Do not smoke. °· Get a humidifier to keep the air in your house free of  odors. °· Get plenty of fresh air. °SEEK MEDICAL CARE IF:  °· Your home remedies are not working, and you need medicine. °· You feel dizzy or lightheaded. °· You are losing weight. °SEEK IMMEDIATE MEDICAL CARE IF:  °· You have persistent and uncontrolled nausea and vomiting. °· You pass out (faint). °MAKE SURE YOU: °· Understand these instructions. °· Will watch your condition. °· Will get help right away if you are not doing well or get worse. °  °This information is not intended to replace advice given to you by your health care provider. Make sure you discuss any questions you have with your health care provider. °  °Document Released: 02/09/2006 Document Revised: 12/24/2012 Document Reviewed: 06/05/2012 °[employer] Interactive Patient Education ©2016 [employer] ° °

## 2015-07-18 NOTE — MAU Provider Note (Signed)
History     CSN: [id number]  Arrival date and time: 07/18/15 1526   First Provider Initiated Contact with Patient 07/18/15 1615      Chief Complaint  Patient presents with  . Pelvic Pain   Pelvic Pain The patient's primary symptoms include pelvic pain. This is a new problem. The current episode started today. The problem occurs intermittently. The problem has been unchanged. Pain severity now: 3/10. The problem affects the left side. Associated symptoms include abdominal pain and nausea. Pertinent negatives include no chills, constipation, diarrhea, dysuria, fever, frequency, urgency or vomiting. The vaginal discharge was normal. There has been no bleeding. Nothing aggravates the symptoms. She has tried nothing for the symptoms. Menstrual history: LMP 05/30/15     Past Medical History  Diagnosis Date  . Normal pregnancy, first 01/25/2012  . SVD (spontaneous vaginal delivery) 01/25/2012  . Infection     UTI    Past Surgical History  Procedure Laterality Date  . Inner ear surgery      Family History  Problem Relation Age of Onset  . Diabetes Mother   . Cancer Maternal Aunt   . Diabetes Maternal Grandmother   . Hypertension Maternal Grandmother     Social History  Substance Use Topics  . Smoking status: Former Smoker    Types: Cigarettes  . Smokeless tobacco: Never Used     Comment: quit early preg  . Alcohol Use: Yes     Comment: occ    Allergies: No Known Allergies  No prescriptions prior to admission    Review of Systems  Constitutional: Negative for fever and chills.  Gastrointestinal: Positive for heartburn, nausea and abdominal pain. Negative for vomiting, diarrhea and constipation.  Genitourinary: Positive for pelvic pain. Negative for dysuria, urgency and frequency.   Physical Exam   Blood pressure 119/79, pulse 86, temperature 97.7 F (36.5 C), temperature source Oral, resp. rate 18, weight 171 lb 8 oz (77.792 kg), last menstrual period 05/24/2015,  unknown if currently breastfeeding.  Physical Exam  Nursing note and vitals reviewed. Constitutional: She is oriented to person, place, and time. She appears well-developed and well-nourished. No distress.  HENT:  Head: Normocephalic.  Cardiovascular: Normal rate.   Respiratory: Effort normal.  GI: Soft. There is no tenderness. There is no rebound.  Genitourinary:   External: no lesion Vagina: small amount of white discharge Cervix: pink, smooth, no CMT Uterus: NSSC Adnexa: mildly tender bilaterally.    Neurological: She is alert and oriented to person, place, and time.  Skin: Skin is warm and dry.  Psychiatric: She has a normal mood and affect.   Results for orders placed or performed during the hospital encounter of 07/18/15 (from the past 24 hour(s))  Urinalysis, Routine w reflex microscopic ([facility])     Status: None   Collection Time: 07/18/15  3:53 PM  Result Value Ref Range   Color, Urine YELLOW YELLOW   APPearance CLEAR CLEAR   Specific Gravity, Urine 1.010 1.005 - 1.030   pH 6.5 5.0 - 8.0   Glucose, UA NEGATIVE NEGATIVE mg/dL   Hgb urine dipstick NEGATIVE NEGATIVE   Bilirubin Urine NEGATIVE NEGATIVE   Ketones, ur NEGATIVE NEGATIVE mg/dL   Protein, ur NEGATIVE NEGATIVE mg/dL   Nitrite NEGATIVE NEGATIVE   Leukocytes, UA NEGATIVE NEGATIVE  Pregnancy, urine POC     Status: Abnormal   Collection Time: 07/18/15  4:01 PM  Result Value Ref Range   Preg Test, Ur POSITIVE (A) NEGATIVE  CBC  Status: Abnormal   Collection Time: 07/18/15  4:13 PM  Result Value Ref Range   WBC 4.6 4.0 - 10.5 K/uL   RBC 4.94 3.87 - 5.11 MIL/uL   Hemoglobin 11.3 (L) 12.0 - 15.0 g/dL   HCT 40.9 (L) 81.1 - 91.4 %   MCV 70.2 (L) 78.0 - 100.0 fL   MCH 22.9 (L) 26.0 - 34.0 pg   MCHC 32.6 30.0 - 36.0 g/dL   RDW 78.2 95.6 - 21.3 %   Platelets 225 150 - 400 K/uL  Wet prep, genital     Status: Abnormal   Collection Time: 07/18/15  4:32 PM  Result Value Ref Range   Yeast Wet Prep HPF POC  NONE SEEN NONE SEEN   Trich, Wet Prep NONE SEEN NONE SEEN   Clue Cells Wet Prep HPF POC NONE SEEN NONE SEEN   WBC, Wet Prep HPF POC FEW (A) NONE SEEN   Sperm NONE SEEN   US Ob Comp Less 14 Wks  07/18/2015  CLINICAL DATA:  Left lower quadrant intermittent cramping. Quantitative beta HCG is pending.LMP was05/22/2017. Gestational age by LMP is7 weeks 6 days. EDC by LMP is02/26/2018. EXAM: OBSTETRIC <14 WK ULTRASOUND TECHNIQUE: Transabdominal ultrasound was performed for evaluation of the gestation as well as the maternal uterus and adnexal regions. COMPARISON:  None. FINDINGS: Intrauterine gestational sac: Present Yolk sac:  Present Embryo:  Present Cardiac Activity: Present Heart Rate: 118 bpm CRL:   4.6  mm   6 w 1 d                  [place] EDC: 03/11/2016 Subchorionic hemorrhage:  None visualized. Maternal uterus/adnexae: Normal appearance of the ovaries. A 5 mm homogeneously hyperechoic nodule is identified within the left ovary raising question of a small dermoid. No free pelvic fluid. IMPRESSION: 1. Single living intrauterine embryo. 2. Clinical and ultrasound dating discrepancy. By today's exam EDC is 03/11/2016. 3. Possible small dermoid within the left ovary. Electronically Signed   By: Norva Pavlov M.D.   On: 07/18/2015 17:09   US Ob Transvaginal  07/18/2015  CLINICAL DATA:  Left lower quadrant intermittent cramping. Quantitative beta HCG is pending.LMP was05/22/2017. Gestational age by LMP is7 weeks 6 days. EDC by LMP is02/26/2018. EXAM: OBSTETRIC <14 WK ULTRASOUND TECHNIQUE: Transabdominal ultrasound was performed for evaluation of the gestation as well as the maternal uterus and adnexal regions. COMPARISON:  None. FINDINGS: Intrauterine gestational sac: Present Yolk sac:  Present Embryo:  Present Cardiac Activity: Present Heart Rate: 118 bpm CRL:   4.6  mm   6 w 1 d                  [place] EDC: 03/11/2016 Subchorionic hemorrhage:  None visualized. Maternal uterus/adnexae: Normal appearance of the  ovaries. A 5 mm homogeneously hyperechoic nodule is identified within the left ovary raising question of a small dermoid. No free pelvic fluid. IMPRESSION: 1. Single living intrauterine embryo. 2. Clinical and ultrasound dating discrepancy. By today's exam EDC is 03/11/2016. 3. Possible small dermoid within the left ovary. Electronically Signed   By: Norva Pavlov M.D.   On: 07/18/2015 17:09     MAU Course  Procedures  MDM   Assessment and Plan   1. Intrauterine pregnancy   2. Pelvic pain in pregnancy, antepartum, first trimester   3. Nausea/vomiting in pregnancy    DC home Comfort measures reviewed  1st Trimester precautions  RX: phenergan PRN #[id number]  Return to MAU as needed  Follow-up Information    Schedule an appointment as soon as possible for a visit with [facility].   Contact information:   [address] [place] [address] [phone]         Tawnya Crook 07/18/2015, 4:16 PM

## 2015-07-18 NOTE — MAU Provider Note (Signed)
History     CSN: [id number]  Arrival date and time: 07/18/15 1526   First Provider Initiated Contact with Patient 07/18/15 1615      Chief Complaint  Patient presents with  . Pelvic Pain   HPI   Diana Flynn is a 35 y.o., [id number], [redacted]w[redacted]d who presents with LLQ cramping. She says the pain started around noon after eating lunch and feels like menstrual cramps. The pain comes and goes and she rated it a 3/10. She denies headache, fever, chest pain, SOB, vomiting, diarrhea, vaginal bleeding or discharge, vaginal itching, or UTI sx. She endorses nausea and heartburn throughout this pregnancy.    Past Medical History  Diagnosis Date  . Normal pregnancy, first 01/25/2012  . SVD (spontaneous vaginal delivery) 01/25/2012  . Infection     UTI    Past Surgical History  Procedure Laterality Date  . Inner ear surgery      Family History  Problem Relation Age of Onset  . Diabetes Mother   . Cancer Maternal Aunt   . Diabetes Maternal Grandmother   . Hypertension Maternal Grandmother     Social History  Substance Use Topics  . Smoking status: Former Smoker    Types: Cigarettes  . Smokeless tobacco: Never Used     Comment: quit early preg  . Alcohol Use: Yes     Comment: occ    Allergies: No Known Allergies  No prescriptions prior to admission    Review of Systems  Constitutional: Negative for fever.  Respiratory: Negative for shortness of breath.   Cardiovascular: Negative for chest pain.  Gastrointestinal: Positive for heartburn, nausea and abdominal pain (LLQ). Negative for vomiting and diarrhea.  Genitourinary: Negative for dysuria and hematuria.  Neurological: Negative for headaches.   Physical Exam   Blood pressure 127/86, pulse 78, temperature 97.7 F (36.5 C), temperature source Oral, resp. rate 18, weight 77.792 kg (171 lb 8 oz), last menstrual period 05/24/2015, unknown if currently breastfeeding.  Physical Exam  Constitutional: She is oriented to person,  place, and time. She appears well-developed and well-nourished. No distress.  HENT:  Head: Normocephalic and atraumatic.  Neck: Normal range of motion.  Respiratory: Effort normal.  GI: Soft. There is tenderness (mild tenderness to palpation in LLQ).  Genitourinary: Cervix exhibits no motion tenderness. Vaginal discharge (small amount of thin, white discharge) found.  Musculoskeletal: Normal range of motion.  Neurological: She is alert and oriented to person, place, and time.  Skin: Skin is warm and dry.  Psychiatric: She has a normal mood and affect. Her behavior is normal. Thought content normal.   Results for orders placed or performed during the hospital encounter of 07/18/15 (from the past 24 hour(s))  Urinalysis, Routine w reflex microscopic (not at [facility])     Status: None   Collection Time: 07/18/15  3:53 PM  Result Value Ref Range   Color, Urine YELLOW YELLOW   APPearance CLEAR CLEAR   Specific Gravity, Urine 1.010 1.005 - 1.030   pH 6.5 5.0 - 8.0   Glucose, UA NEGATIVE NEGATIVE mg/dL   Hgb urine dipstick NEGATIVE NEGATIVE   Bilirubin Urine NEGATIVE NEGATIVE   Ketones, ur NEGATIVE NEGATIVE mg/dL   Protein, ur NEGATIVE NEGATIVE mg/dL   Nitrite NEGATIVE NEGATIVE   Leukocytes, UA NEGATIVE NEGATIVE  Pregnancy, urine POC     Status: Abnormal   Collection Time: 07/18/15  4:01 PM  Result Value Ref Range   Preg Test, Ur POSITIVE (A) NEGATIVE  CBC  Status: Abnormal   Collection Time: 07/18/15  4:13 PM  Result Value Ref Range   WBC 4.6 4.0 - 10.5 K/uL   RBC 4.94 3.87 - 5.11 MIL/uL   Hemoglobin 11.3 (L) 12.0 - 15.0 g/dL   HCT 16.134.7 (L) 09.636.0 - 04.546.0 %   MCV 70.2 (L) 78.0 - 100.0 fL   MCH 22.9 (L) 26.0 - 34.0 pg   MCHC 32.6 30.0 - 36.0 g/dL   RDW 40.913.4 81.111.5 - 91.415.5 %   Platelets 225 150 - 400 K/uL  hCG, quantitative, pregnancy     Status: Abnormal   Collection Time: 07/18/15  4:13 PM  Result Value Ref Range   hCG, Beta Chain, Quant, S 34879 (H) <5 mIU/mL  Wet prep, genital      Status: Abnormal   Collection Time: 07/18/15  4:32 PM  Result Value Ref Range   Yeast Wet Prep HPF POC NONE SEEN NONE SEEN   Trich, Wet Prep NONE SEEN NONE SEEN   Clue Cells Wet Prep HPF POC NONE SEEN NONE SEEN   WBC, Wet Prep HPF POC FEW (A) NONE SEEN   Sperm NONE SEEN    [place] Ob Comp Less 14 Wks  07/18/2015  CLINICAL DATA:  Left lower quadrant intermittent cramping. Quantitative beta HCG is pending.LMP was05/22/2017. Gestational age by LMP is7 weeks 6 days. EDC by LMP is02/26/2018. EXAM: OBSTETRIC <14 WK ULTRASOUND TECHNIQUE: Transabdominal ultrasound was performed for evaluation of the gestation as well as the maternal uterus and adnexal regions. COMPARISON:  None. FINDINGS: Intrauterine gestational sac: Present Yolk sac:  Present Embryo:  Present Cardiac Activity: Present Heart Rate: 118 bpm CRL:   4.6  mm   6 w 1 d                  [place] EDC: 03/11/2016 Subchorionic hemorrhage:  None visualized. Maternal uterus/adnexae: Normal appearance of the ovaries. A 5 mm homogeneously hyperechoic nodule is identified within the left ovary raising question of a small dermoid. No free pelvic fluid. IMPRESSION: 1. Single living intrauterine embryo. 2. Clinical and ultrasound dating discrepancy. By today's exam EDC is 03/11/2016. 3. Possible small dermoid within the left ovary. Electronically Signed   By: Norva PavlovElizabeth  Brown M.D.   On: 07/18/2015 17:09   [place] Ob Transvaginal  07/18/2015  CLINICAL DATA:  Left lower quadrant intermittent cramping. Quantitative beta HCG is pending.LMP was05/22/2017. Gestational age by LMP is7 weeks 6 days. EDC by LMP is02/26/2018. EXAM: OBSTETRIC <14 WK ULTRASOUND TECHNIQUE: Transabdominal ultrasound was performed for evaluation of the gestation as well as the maternal uterus and adnexal regions. COMPARISON:  None. FINDINGS: Intrauterine gestational sac: Present Yolk sac:  Present Embryo:  Present Cardiac Activity: Present Heart Rate: 118 bpm CRL:   4.6  mm   6 w 1 d                  US EDC:  03/11/2016 Subchorionic hemorrhage:  None visualized. Maternal uterus/adnexae: Normal appearance of the ovaries. A 5 mm homogeneously hyperechoic nodule is identified within the left ovary raising question of a small dermoid. No free pelvic fluid. IMPRESSION: 1. Single living intrauterine embryo. 2. Clinical and ultrasound dating discrepancy. By today's exam EDC is 03/11/2016. 3. Possible small dermoid within the left ovary. Electronically Signed   By: Norva PavlovElizabeth  Brown M.D.   On: 07/18/2015 17:09   MAU Course  Procedures  MDM   Assessment and Plan  1. Intrauterine pregnancy  Honor Frison E [place] 07/18/2015, 6:07 PM

## 2015-07-18 NOTE — MAU Note (Signed)
Mild pain on left side. Started today at work, comes and goes.like a menstrual cramp. No bleeding.

## 2015-07-19 LAB — GC/CHLAMYDIA PROBE AMP (~~LOC~~) NOT AT ARMC
CHLAMYDIA, DNA PROBE: NEGATIVE
NEISSERIA GONORRHEA: NEGATIVE

## 2015-07-19 LAB — RPR: RPR Ser Ql: NONREACTIVE

## 2015-07-19 LAB — HIV ANTIBODY (ROUTINE TESTING W REFLEX): HIV SCREEN 4TH GENERATION: NONREACTIVE

## 2015-08-13 LAB — OB RESULTS CONSOLE RPR: RPR: NONREACTIVE

## 2015-08-13 LAB — OB RESULTS CONSOLE ANTIBODY SCREEN: ANTIBODY SCREEN: NEGATIVE

## 2015-08-13 LAB — OB RESULTS CONSOLE RUBELLA ANTIBODY, IGM: Rubella: IMMUNE

## 2015-08-13 LAB — OB RESULTS CONSOLE GC/CHLAMYDIA
CHLAMYDIA, DNA PROBE: NEGATIVE
GC PROBE AMP, GENITAL: NEGATIVE

## 2015-08-13 LAB — OB RESULTS CONSOLE HIV ANTIBODY (ROUTINE TESTING): HIV: NONREACTIVE

## 2015-08-13 LAB — OB RESULTS CONSOLE ABO/RH: RH Type: POSITIVE

## 2015-08-13 LAB — OB RESULTS CONSOLE HEPATITIS B SURFACE ANTIGEN: HEP B S AG: NEGATIVE

## 2015-12-20 ENCOUNTER — Inpatient Hospital Stay (HOSPITAL_COMMUNITY)
Admission: AD | Admit: 2015-12-20 | Discharge: 2015-12-20 | Disposition: A | Discharge status: Home / Self Care | Payer: Medicaid Other | Source: Ambulatory Visit | Attending: Obstetrics and Gynecology | Admitting: Obstetrics and Gynecology

## 2015-12-20 ENCOUNTER — Encounter (HOSPITAL_COMMUNITY): Payer: Self-pay | Admitting: *Deleted

## 2015-12-20 DIAGNOSIS — M545 Low back pain: Secondary | ICD-10-CM | POA: Insufficient documentation

## 2015-12-20 DIAGNOSIS — N949 Unspecified condition associated with female genital organs and menstrual cycle: Secondary | ICD-10-CM

## 2015-12-20 DIAGNOSIS — O99891 Other specified diseases and conditions complicating pregnancy: Secondary | ICD-10-CM

## 2015-12-20 DIAGNOSIS — Z87891 Personal history of nicotine dependence: Secondary | ICD-10-CM | POA: Diagnosis not present

## 2015-12-20 DIAGNOSIS — R102 Pelvic and perineal pain: Secondary | ICD-10-CM | POA: Diagnosis not present

## 2015-12-20 DIAGNOSIS — O26893 Other specified pregnancy related conditions, third trimester: Secondary | ICD-10-CM | POA: Diagnosis not present

## 2015-12-20 DIAGNOSIS — M549 Dorsalgia, unspecified: Secondary | ICD-10-CM

## 2015-12-20 DIAGNOSIS — O9989 Other specified diseases and conditions complicating pregnancy, childbirth and the puerperium: Secondary | ICD-10-CM

## 2015-12-20 DIAGNOSIS — Z3A28 28 weeks gestation of pregnancy: Secondary | ICD-10-CM | POA: Insufficient documentation

## 2015-12-20 LAB — URINALYSIS, ROUTINE W REFLEX MICROSCOPIC
BILIRUBIN URINE: NEGATIVE
Glucose, UA: NEGATIVE mg/dL
HGB URINE DIPSTICK: NEGATIVE
Ketones, ur: NEGATIVE mg/dL
Leukocytes, UA: NEGATIVE
NITRITE: NEGATIVE
PROTEIN: NEGATIVE mg/dL
Specific Gravity, Urine: 1.015 (ref 1.005–1.030)
pH: 6.5 (ref 5.0–8.0)

## 2015-12-20 NOTE — MAU Note (Signed)
Urine in lab 

## 2015-12-20 NOTE — MAU Note (Signed)
Has been having some dizziness; c/o swollen,numb hands;

## 2015-12-20 NOTE — MAU Provider Note (Signed)
History     CSN: [id number]  Arrival date and time: 12/20/15 1503   First Provider Initiated Contact with Patient 12/20/15 1557      Chief Complaint  Patient presents with  . Abdominal Pain   HPI    Diana Flynn is a 35 y.o.female [id number] @ 2769w2d here in MAU with left sided lower back pain. The pain first started today while she was sitting at work. It felt like something was poking her on her nerve. The pain lasted a while, coming every 2 minutes. Currently she is not feeling the pain.  She felt the pain worsened when she was walking. The pain worsens when she is walking up the stairs.   She denies vaginal bleeding  + fetal movement.   OB History    Gravida Para Term Preterm AB Living   4 1 1  0 2 1   SAB TAB Ectopic Multiple Live Births   0 2 0 0 1      Obstetric Comments   TAB with medication       Past Medical History:  Diagnosis Date  . Infection    UTI  . Normal pregnancy, first 01/25/2012  . SVD (spontaneous vaginal delivery) 01/25/2012    Past Surgical History:  Procedure Laterality Date  . INNER EAR SURGERY      Family History  Problem Relation Age of Onset  . Diabetes Mother   . Diabetes Maternal Grandmother   . Hypertension Maternal Grandmother   . Cancer Maternal Aunt     Social History  Substance Use Topics  . Smoking status: Former Smoker    Types: Cigarettes  . Smokeless tobacco: Never Used     Comment: quit early preg  . Alcohol use Yes     Comment: occ    Allergies: No Known Allergies  Prescriptions Prior to Admission  Medication Sig Dispense Refill Last Dose  . promethazine (PHENERGAN) 25 MG tablet Take 0.5-1 tablets (12.5-25 mg total) by mouth every 6 (six) hours as needed. 30 tablet 0    Results for orders placed or performed during the hospital encounter of 12/20/15 (from the past 48 hour(s))  Urinalysis, Routine w reflex microscopic     Status: None   Collection Time: 12/20/15  3:10 PM  Result Value Ref Range   Color,  Urine YELLOW YELLOW   APPearance CLEAR CLEAR   Specific Gravity, Urine 1.015 1.005 - 1.030   pH 6.5 5.0 - 8.0   Glucose, UA NEGATIVE NEGATIVE mg/dL   Hgb urine dipstick NEGATIVE NEGATIVE   Bilirubin Urine NEGATIVE NEGATIVE   Ketones, ur NEGATIVE NEGATIVE mg/dL   Protein, ur NEGATIVE NEGATIVE mg/dL   Nitrite NEGATIVE NEGATIVE   Leukocytes, UA NEGATIVE NEGATIVE    Comment: Microscopic not done on urines with negative protein, blood, leukocytes, nitrite, or glucose < 500 mg/dL.    Review of Systems  Constitutional: Negative for chills and fever.  Gastrointestinal: Positive for abdominal pain.  Genitourinary: Negative for dysuria, frequency, hematuria and urgency.  Musculoskeletal: Positive for back pain.   Physical Exam   Blood pressure 115/70, pulse 106, temperature 97.4 F (36.3 C), temperature source Oral, resp. rate 18, last menstrual period 05/24/2015, unknown if currently breastfeeding.  Physical Exam  Constitutional: She is oriented to person, place, and time. She appears well-developed and well-nourished. No distress.  HENT:  Head: Normocephalic.  Eyes: Pupils are equal, round, and reactive to light.  Respiratory: Effort normal.  GI: Soft. Normal appearance. There is no rebound.  Genitourinary:  Genitourinary Comments: Dilation: Closed Effacement (%): Thick Cervical Position: Posterior Station:  (high) Exam by:: Jerrye BushyJ Rausch NP  Musculoskeletal: Normal range of motion.  Neurological: She is alert and oriented to person, place, and time.  Skin: Skin is warm. She is not diaphoretic.  Psychiatric: Her behavior is normal.   Fetal Tracing: Baseline: 140 bpm  Variability: Moderate  Accelerations: 15x15  Decelerations: none Toco:none  MAU Course  Procedures  None  MDM  UA Discussed patient with Dr. Ellyn HackBovard, ok to dc home.   Assessment and Plan   A:  1. Round ligament pain   2. Back pain affecting pregnancy in third trimester     P:  Discharge home in  stable condition Encouraged a abdominal binder for pain Return to MAU if symptoms worsen   Duane LopeJennifer I Rasch, NP 12/20/2015 4:27 PM

## 2015-12-20 NOTE — Discharge Instructions (Signed)
Round Ligament Pain Introduction The round ligament is a cord of muscle and tissue that helps to support the uterus. It can become a source of pain during pregnancy if it becomes stretched or twisted as the baby grows. The pain usually begins in the second trimester of pregnancy, and it can come and go until the baby is delivered. It is not a serious problem, and it does not cause harm to the baby. Round ligament pain is usually a short, sharp, and pinching pain, but it can also be a dull, lingering, and aching pain. The pain is felt in the lower side of the abdomen or in the groin. It usually starts deep in the groin and moves up to the outside of the hip area. Pain can occur with:  A sudden change in position.  Rolling over in bed.  Coughing or sneezing.  Physical activity. Follow these instructions at home: Watch your condition for any changes. Take these steps to help with your pain:  When the pain starts, relax. Then try:  Sitting down.  Flexing your knees up to your abdomen.  Lying on your side with one pillow under your abdomen and another pillow between your legs.  Sitting in a warm bath for 15-20 minutes or until the pain goes away.  Take over-the-counter and prescription medicines only as told by your health care provider.  Move slowly when you sit and stand.  Avoid long walks if they cause pain.  Stop or lessen your physical activities if they cause pain. Contact a health care provider if:  Your pain does not go away with treatment.  You feel pain in your back that you did not have before.  Your medicine is not helping. Get help right away if:  You develop a fever or chills.  You develop uterine contractions.  You develop vaginal bleeding.  You develop nausea or vomiting.  You develop diarrhea.  You have pain when you urinate. This information is not intended to replace advice given to you by your health care provider. Make sure you discuss any questions  you have with your health care provider. Document Released: 09/28/2007 Document Revised: 05/27/2015 Document Reviewed: 02/25/2014  2017 Elsevier  

## 2015-12-20 NOTE — MAU Note (Signed)
C/o abdominal pain for past 2 days- cramms got stronger today around 1400;

## 2016-01-03 NOTE — L&D Delivery Note (Signed)
Delivery Note Pt received epidural and pushed about 15 minutes.  At 12:21 PM a healthy female was delivered via NSVD  (Presentation: ;  LOA).  APGAR:8 ,9; weight pending .   Placenta status: delivered spontaneously.  Cord:  with the following complications: none .   Anesthesia:  epidural Episiotomy: none Lacerations: periurethral abrasion on right Suture Repair: 3.0 vicryl rapide x one interrupted for hemostasis Est. Blood Loss (mL):  175ml  Mom to postpartum.  Baby to Couplet care / Skin to Skin.  Oliver PilaRICHARDSON,Shrinika Blatz W 03/07/2016, 12:38 PM

## 2016-02-11 LAB — OB RESULTS CONSOLE GBS: GBS: POSITIVE

## 2016-02-17 ENCOUNTER — Other Ambulatory Visit: Payer: Self-pay | Admitting: Obstetrics & Gynecology

## 2016-02-24 ENCOUNTER — Telehealth (HOSPITAL_COMMUNITY): Payer: Self-pay | Admitting: *Deleted

## 2016-02-24 ENCOUNTER — Encounter (HOSPITAL_COMMUNITY): Payer: Self-pay | Admitting: *Deleted

## 2016-02-24 NOTE — Telephone Encounter (Signed)
Preadmission screen  

## 2016-03-07 ENCOUNTER — Inpatient Hospital Stay (HOSPITAL_COMMUNITY)
Admission: RE | Admit: 2016-03-07 | Discharge: 2016-03-09 | DRG: 775 | Disposition: A | Discharge status: Home / Self Care | Payer: Medicaid Other | Source: Ambulatory Visit | Attending: Obstetrics and Gynecology | Admitting: Obstetrics and Gynecology

## 2016-03-07 ENCOUNTER — Inpatient Hospital Stay (HOSPITAL_COMMUNITY): Payer: Medicaid Other | Admitting: Anesthesiology

## 2016-03-07 ENCOUNTER — Encounter (HOSPITAL_COMMUNITY): Payer: Self-pay

## 2016-03-07 DIAGNOSIS — G5600 Carpal tunnel syndrome, unspecified upper limb: Secondary | ICD-10-CM | POA: Diagnosis present

## 2016-03-07 DIAGNOSIS — O99824 Streptococcus B carrier state complicating childbirth: Secondary | ICD-10-CM | POA: Diagnosis present

## 2016-03-07 DIAGNOSIS — O9902 Anemia complicating childbirth: Secondary | ICD-10-CM | POA: Diagnosis present

## 2016-03-07 DIAGNOSIS — D573 Sickle-cell trait: Secondary | ICD-10-CM | POA: Diagnosis present

## 2016-03-07 DIAGNOSIS — Z87891 Personal history of nicotine dependence: Secondary | ICD-10-CM

## 2016-03-07 DIAGNOSIS — Z8249 Family history of ischemic heart disease and other diseases of the circulatory system: Secondary | ICD-10-CM

## 2016-03-07 DIAGNOSIS — Z3493 Encounter for supervision of normal pregnancy, unspecified, third trimester: Secondary | ICD-10-CM | POA: Diagnosis present

## 2016-03-07 DIAGNOSIS — Z3A39 39 weeks gestation of pregnancy: Secondary | ICD-10-CM

## 2016-03-07 DIAGNOSIS — Z833 Family history of diabetes mellitus: Secondary | ICD-10-CM

## 2016-03-07 LAB — CBC
HEMATOCRIT: 32.7 % — AB (ref 36.0–46.0)
Hemoglobin: 10.6 g/dL — ABNORMAL LOW (ref 12.0–15.0)
MCH: 23.3 pg — AB (ref 26.0–34.0)
MCHC: 32.4 g/dL (ref 30.0–36.0)
MCV: 72 fL — AB (ref 78.0–100.0)
PLATELETS: 209 10*3/uL (ref 150–400)
RBC: 4.54 MIL/uL (ref 3.87–5.11)
RDW: 13.8 % (ref 11.5–15.5)
WBC: 5.2 10*3/uL (ref 4.0–10.5)

## 2016-03-07 LAB — TYPE AND SCREEN
ABO/RH(D): O POS
Antibody Screen: NEGATIVE

## 2016-03-07 MED ORDER — OXYTOCIN BOLUS FROM INFUSION
500.0000 mL | Freq: Once | INTRAVENOUS | Status: DC
Start: 2016-03-07 — End: 2016-03-07

## 2016-03-07 MED ORDER — ONDANSETRON HCL 4 MG PO TABS
4.0000 mg | ORAL_TABLET | ORAL | Status: DC | PRN
Start: 2016-03-07 — End: 2016-03-09

## 2016-03-07 MED ORDER — ACETAMINOPHEN 325 MG PO TABS
650.0000 mg | ORAL_TABLET | ORAL | Status: DC | PRN
  Administered 2016-03-07 – 2016-03-08 (×3): 650 mg via ORAL
  Filled 2016-03-07 (×3): qty 2

## 2016-03-07 MED ORDER — BUTORPHANOL TARTRATE 1 MG/ML IJ SOLN: 1.0000 mg | INTRAMUSCULAR | Status: DC | PRN

## 2016-03-07 MED ORDER — WITCH HAZEL-GLYCERIN EX PADS: 1.0000 "application " | MEDICATED_PAD | CUTANEOUS | Status: DC | PRN

## 2016-03-07 MED ORDER — OXYCODONE-ACETAMINOPHEN 5-325 MG PO TABS: 1.0000 | ORAL_TABLET | ORAL | Status: DC | PRN

## 2016-03-07 MED ORDER — PENICILLIN G POTASSIUM 5000000 UNITS IJ SOLR
5.0000 10*6.[IU] | Freq: Once | INTRAVENOUS | Status: AC
  Administered 2016-03-07: 5 10*6.[IU] via INTRAVENOUS
  Filled 2016-03-07: qty 5

## 2016-03-07 MED ORDER — ONDANSETRON HCL 4 MG/2ML IJ SOLN: 4.0000 mg | INTRAMUSCULAR | Status: DC | PRN

## 2016-03-07 MED ORDER — DIPHENHYDRAMINE HCL 25 MG PO CAPS: 25.0000 mg | ORAL_CAPSULE | Freq: Four times a day (QID) | ORAL | Status: DC | PRN

## 2016-03-07 MED ORDER — PRENATAL MULTIVITAMIN CH
1.0000 | ORAL_TABLET | Freq: Every day | ORAL | Status: DC
  Administered 2016-03-08: 1 via ORAL
  Filled 2016-03-07: qty 1

## 2016-03-07 MED ORDER — IBUPROFEN 600 MG PO TABS
600.0000 mg | ORAL_TABLET | Freq: Four times a day (QID) | ORAL | Status: DC
  Administered 2016-03-07 – 2016-03-09 (×7): 600 mg via ORAL
  Filled 2016-03-07 (×7): qty 1

## 2016-03-07 MED ORDER — EPHEDRINE 5 MG/ML INJ
10.0000 mg | INTRAVENOUS | Status: DC | PRN
  Filled 2016-03-07: qty 4

## 2016-03-07 MED ORDER — TETANUS-DIPHTH-ACELL PERTUSSIS 5-2.5-18.5 LF-MCG/0.5 IM SUSP: 0.5000 mL | Freq: Once | INTRAMUSCULAR | Status: DC

## 2016-03-07 MED ORDER — LIDOCAINE HCL (PF) 1 % IJ SOLN
30.0000 mL | INTRAMUSCULAR | Status: DC | PRN
  Filled 2016-03-07: qty 30

## 2016-03-07 MED ORDER — BENZOCAINE-MENTHOL 20-0.5 % EX AERO
1.0000 "application " | INHALATION_SPRAY | CUTANEOUS | Status: DC | PRN
  Administered 2016-03-07: 1 via TOPICAL
  Filled 2016-03-07: qty 56

## 2016-03-07 MED ORDER — TERBUTALINE SULFATE 1 MG/ML IJ SOLN
0.2500 mg | Freq: Once | INTRAMUSCULAR | Status: DC | PRN
  Filled 2016-03-07: qty 1

## 2016-03-07 MED ORDER — ONDANSETRON HCL 4 MG/2ML IJ SOLN: 4.0000 mg | Freq: Four times a day (QID) | INTRAMUSCULAR | Status: DC | PRN

## 2016-03-07 MED ORDER — LACTATED RINGERS IV SOLN: 500.0000 mL | Freq: Once | INTRAVENOUS | Status: DC

## 2016-03-07 MED ORDER — LACTATED RINGERS IV SOLN
INTRAVENOUS | Status: DC
  Administered 2016-03-07: 08:00:00 via INTRAVENOUS

## 2016-03-07 MED ORDER — FENTANYL 2.5 MCG/ML BUPIVACAINE 1/10 % EPIDURAL INFUSION (WH - ANES)
14.0000 mL/h | INTRAMUSCULAR | Status: DC | PRN
  Administered 2016-03-07: 14 mL/h via EPIDURAL
  Filled 2016-03-07: qty 100

## 2016-03-07 MED ORDER — PHENYLEPHRINE 40 MCG/ML (10ML) SYRINGE FOR IV PUSH (FOR BLOOD PRESSURE SUPPORT)
80.0000 ug | PREFILLED_SYRINGE | INTRAVENOUS | Status: DC | PRN
  Filled 2016-03-07: qty 5

## 2016-03-07 MED ORDER — ACETAMINOPHEN 325 MG PO TABS: 650.0000 mg | ORAL_TABLET | ORAL | Status: DC | PRN

## 2016-03-07 MED ORDER — SENNOSIDES-DOCUSATE SODIUM 8.6-50 MG PO TABS
2.0000 | ORAL_TABLET | ORAL | Status: DC
  Administered 2016-03-07 – 2016-03-08 (×2): 2 via ORAL
  Filled 2016-03-07 (×2): qty 2

## 2016-03-07 MED ORDER — COCONUT OIL OIL: 1.0000 "application " | TOPICAL_OIL | Status: DC | PRN

## 2016-03-07 MED ORDER — LIDOCAINE HCL (PF) 1 % IJ SOLN
INTRAMUSCULAR | Status: DC | PRN
  Administered 2016-03-07 (×2): 5 mL

## 2016-03-07 MED ORDER — PHENYLEPHRINE 40 MCG/ML (10ML) SYRINGE FOR IV PUSH (FOR BLOOD PRESSURE SUPPORT)
80.0000 ug | PREFILLED_SYRINGE | INTRAVENOUS | Status: DC | PRN
  Filled 2016-03-07: qty 10
  Filled 2016-03-07: qty 5

## 2016-03-07 MED ORDER — SOD CITRATE-CITRIC ACID 500-334 MG/5ML PO SOLN: 30.0000 mL | ORAL | Status: DC | PRN

## 2016-03-07 MED ORDER — PENICILLIN G POT IN DEXTROSE 60000 UNIT/ML IV SOLN
3.0000 10*6.[IU] | INTRAVENOUS | Status: DC
  Filled 2016-03-07 (×4): qty 50

## 2016-03-07 MED ORDER — SIMETHICONE 80 MG PO CHEW: 80.0000 mg | CHEWABLE_TABLET | ORAL | Status: DC | PRN

## 2016-03-07 MED ORDER — ZOLPIDEM TARTRATE 5 MG PO TABS
5.0000 mg | ORAL_TABLET | Freq: Every evening | ORAL | Status: DC | PRN
  Administered 2016-03-08: 5 mg via ORAL
  Filled 2016-03-07: qty 1

## 2016-03-07 MED ORDER — OXYCODONE-ACETAMINOPHEN 5-325 MG PO TABS: 2.0000 | ORAL_TABLET | ORAL | Status: DC | PRN

## 2016-03-07 MED ORDER — OXYTOCIN 40 UNITS IN LACTATED RINGERS INFUSION - SIMPLE MED: 2.5000 [IU]/h | INTRAVENOUS | Status: DC

## 2016-03-07 MED ORDER — LACTATED RINGERS IV SOLN: 500.0000 mL | INTRAVENOUS | Status: DC | PRN

## 2016-03-07 MED ORDER — OXYTOCIN 40 UNITS IN LACTATED RINGERS INFUSION - SIMPLE MED
1.0000 m[IU]/min | INTRAVENOUS | Status: DC
  Administered 2016-03-07: 2 m[IU]/min via INTRAVENOUS
  Filled 2016-03-07: qty 1000

## 2016-03-07 MED ORDER — DIBUCAINE 1 % RE OINT: 1.0000 "application " | TOPICAL_OINTMENT | RECTAL | Status: DC | PRN

## 2016-03-07 MED ORDER — DIPHENHYDRAMINE HCL 50 MG/ML IJ SOLN: 12.5000 mg | INTRAMUSCULAR | Status: DC | PRN

## 2016-03-07 NOTE — Progress Notes (Signed)
Patient ID: Diana MountsLeann Flynn, female   DOB: [birth year], 36 y.o.   MRN: [id number] Pt admitted and pitocin begun. Feeling only very mild cramping. PCN on board for +GBS  FHR category 1 Cervix 50/2-3/-2  AROM clear  Planning epidural  Pitocin per protocol.

## 2016-03-07 NOTE — Anesthesia Procedure Notes (Signed)
Epidural Patient location during procedure: OB  Staffing Anesthesiologist: Shadi Larner Performed: anesthesiologist   Preanesthetic Checklist Completed: patient identified, site marked, surgical consent, pre-op evaluation, timeout performed, IV checked, risks and benefits discussed and monitors and equipment checked  Epidural Patient position: sitting Prep: DuraPrep Patient monitoring: heart rate, continuous pulse ox and blood pressure Approach: right paramedian Location: L3-L4 Injection technique: LOR saline  Needle:  Needle type: Tuohy  Needle gauge: 17 G Needle length: 9 cm and 9 Needle insertion depth: 7 cm Catheter type: closed end flexible Catheter size: 20 Guage Catheter at skin depth: 11 cm Test dose: negative  Assessment Events: blood not aspirated, injection not painful, no injection resistance, negative IV test and no paresthesia  Additional Notes Patient identified. Risks/Benefits/Options discussed with patient including but not limited to bleeding, infection, nerve damage, paralysis, failed block, incomplete pain control, headache, blood pressure changes, nausea, vomiting, reactions to medication both or allergic, itching and postpartum back pain. Confirmed with bedside nurse the patient's most recent platelet count. Confirmed with patient that they are not currently taking any anticoagulation, have any bleeding history or any family history of bleeding disorders. Patient expressed understanding and wished to proceed. All questions were answered. Sterile technique was used throughout the entire procedure. Please see nursing notes for vital signs. Test dose was given through epidural needle and negative prior to continuing to dose epidural or start infusion. Warning signs of high block given to the patient including shortness of breath, tingling/numbness in hands, complete motor block, or any concerning symptoms with instructions to call for help. Patient was given  instructions on fall risk and not to get out of bed. All questions and concerns addressed with instructions to call with any issues.     

## 2016-03-07 NOTE — Anesthesia Postprocedure Evaluation (Signed)
Anesthesia Post Note  Patient: Diana Flynn  Procedure(s) Performed: * No procedures listed *  Patient location during evaluation: Mother Baby Anesthesia Type: Epidural Level of consciousness: awake and alert Pain management: satisfactory to patient Vital Signs Assessment: post-procedure vital signs reviewed and stable Respiratory status: respiratory function stable Cardiovascular status: stable Postop Assessment: no headache, no backache, epidural receding, patient able to bend at knees, no signs of nausea or vomiting and adequate PO intake Anesthetic complications: no        Last Vitals:  Vitals:   03/07/16 1542 03/07/16 1941  BP: 124/84 123/88  Pulse: 85 (!) 103  Resp: 18 18  Temp: 36.5 C 37.2 C    Last Pain:  Vitals:   03/07/16 1941  TempSrc: Oral  PainSc: 4    Pain Goal:                 Alexsandra Shontz

## 2016-03-07 NOTE — Anesthesia Preprocedure Evaluation (Signed)

## 2016-03-07 NOTE — Anesthesia Pain Management Evaluation Note (Signed)
  CRNA Pain Management Visit Note  Patient: Diana Flynn, 36 y.o., female  "Hello I am a member of the anesthesia team [facility]. We have an anesthesia team available at all times to provide care throughout the hospital, including epidural management and anesthesia for C-section. I don't know your plan for the delivery whether it a natural birth, water birth, IV sedation, nitrous supplementation, doula or epidural, but we want to meet your pain goals."   1.Was your pain managed to your expectations on prior hospitalizations?   Yes   2.What is your expectation for pain management during this hospitalization?     Epidural and IV pain meds  3.How can we help you reach that goal? epidural  Record the patient's initial score and the patient's pain goal.   Pain: 0  Pain Goal: 4 [facility] wants you to be able to say your pain was always managed very well.  Diana Flynn 03/07/2016

## 2016-03-07 NOTE — H&P (Signed)
Diana MountsLeann Flynn is a 36 y.o. female G4P1021 at 39+ weeks (EDD 03/11/16 by 6 week US) presenting for IOL at term with favorable cervix. Prenatal care significant for +GBS status and AMA.  Had an US which was WNL except for short nasal bones, but panorama WNL and low risk.  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  She has had significant carpal tunnel syndrome for the last 2 weeks or pregnancy.  OB History    Gravida Para Term Preterm AB Living   4 1 1  0 2 1   SAB TAB Ectopic Multiple Live Births   0 2 0 0 1      Obstetric Comments   TAB with medication     2014 NSVD 7#6oz female EAB x 1  Past Medical History:  Diagnosis Date  . Hx of varicella   . Infection    UTI  . Normal pregnancy, first 01/25/2012  . SVD (spontaneous vaginal delivery) 01/25/2012   Past Surgical History:  Procedure Laterality Date  . INNER EAR SURGERY     Family History: family history includes Cancer in her maternal aunt; Diabetes in her maternal grandmother and mother; Hypertension in her maternal grandmother. Social History:  reports that she has quit smoking. Her smoking use included Cigarettes. She has never used smokeless tobacco. She reports that she drinks alcohol. She reports that she uses drugs, including Marijuana.     Maternal Diabetes: No Genetic Screening: Normal Panorama Maternal Ultrasounds/Referrals: Abnormal:  Findings:   Other: short nasal bones, felt to be from african american ethnicity since Panorama WNL Fetal Ultrasounds or other Referrals:  None Maternal Substance Abuse:  No--stopped smoking with pregnancy Significant Maternal Medications:  None Significant Maternal Lab Results:  Lab values include: Group B Strep positive Other Comments:  Sickle trait carrier, [name] unknown  Review of Systems  Gastrointestinal: Negative for abdominal pain.  Genitourinary: Negative for dysuria.   Maternal Medical History:  Contractions: Frequency: irregular.   Perceived severity is mild.     Fetal activity: Perceived fetal activity is normal.   Last perceived fetal movement was within the past hour.    Prenatal Complications - Diabetes: none.      Last menstrual period 05/24/2015, unknown if currently breastfeeding. Maternal Exam:  Uterine Assessment: Contraction strength is mild.  Contraction frequency is irregular.   Abdomen: Patient reports no abdominal tenderness. Fetal presentation: vertex  Introitus: Normal vulva. Normal vagina.    Physical Exam  Constitutional: She is oriented to person, place, and time. She appears well-developed and well-nourished.  Cardiovascular: Normal rate and regular rhythm.   Respiratory: Effort normal.  GI: Soft.  Genitourinary: Vagina normal.  Neurological: She is alert and oriented to person, place, and time.  Psychiatric: She has a normal mood and affect.    Prenatal labs: ABO, Rh: O/Positive/-- (08/11 0000) Antibody: Negative (08/11 0000) Rubella: Immune (08/11 0000) RPR: Nonreactive (08/11 0000)  HBsAg: Negative (08/11 0000)  HIV: Non-reactive (08/11 0000)  GBS: Positive (02/09 0000)  Panorama and AFP negative One hour GCT 70  Assessment/Plan: Pt for IOL at term.  Will get PCN on board for +GBS status and then proceed with AROM.  Piocin per protocol.  Epidural prn.  Oliver PilaICHARDSON,Emberly Tomasso W 03/07/2016, 6:23 AM

## 2016-03-08 LAB — CBC
HCT: 30.1 % — ABNORMAL LOW (ref 36.0–46.0)
Hemoglobin: 9.8 g/dL — ABNORMAL LOW (ref 12.0–15.0)
MCH: 23.4 pg — AB (ref 26.0–34.0)
MCHC: 32.6 g/dL (ref 30.0–36.0)
MCV: 72 fL — ABNORMAL LOW (ref 78.0–100.0)
PLATELETS: 203 10*3/uL (ref 150–400)
RBC: 4.18 MIL/uL (ref 3.87–5.11)
RDW: 13.8 % (ref 11.5–15.5)
WBC: 6.5 10*3/uL (ref 4.0–10.5)

## 2016-03-08 LAB — RPR: RPR Ser Ql: NONREACTIVE

## 2016-03-08 MED ORDER — PREPLUS 27-1 MG PO TABS: 1.0000 | ORAL_TABLET | Freq: Every day | ORAL | 12 refills | Status: AC

## 2016-03-08 MED ORDER — IBUPROFEN 800 MG PO TABS: 800.0000 mg | ORAL_TABLET | Freq: Three times a day (TID) | ORAL | 1 refills | Status: DC | PRN

## 2016-03-08 NOTE — Discharge Summary (Signed)
OB Discharge Summary     Patient Name: Diana Flynn DOB: [birth year] MRN: [id number]  Date of admission: 03/07/2016 Delivering MD: Huel Cote   Date of discharge: 03/08/2016  Admitting diagnosis: INDUCTION Intrauterine pregnancy: [redacted]w[redacted]d     Secondary diagnosis:  Active Problems:   Indication for care in labor and delivery, antepartum   NSVD (normal spontaneous vaginal delivery)  Additional problems: N/A     Discharge diagnosis: Term Pregnancy Delivered                                                                                                Post partum procedures:N/A  Augmentation: AROM and Pitocin  Complications: None  Hospital course:  Induction of Labor With Vaginal Delivery   36 y.o. yo [id number] at [redacted]w[redacted]d was admitted to the hospital 03/07/2016 for induction of labor.  Indication for induction: Favorable cervix at term.  Patient had an uncomplicated labor course as follows: Membrane Rupture Time/Date: 9:10 AM ,03/07/2016   Intrapartum Procedures: Episiotomy: None [1]                                         Lacerations:  1st degree [2]  Patient had delivery of a Viable infant.  Information for the patient's newborn:  Kiaya, [place] [[id number]]  Delivery Method: Vaginal, Spontaneous Delivery (Filed from Delivery Summary)   03/07/2016  Details of delivery can be found in separate delivery note.  Patient had a routine postpartum course. Patient is discharged home 03/08/16.  Physical exam  Vitals:   03/07/16 1542 03/07/16 1941 03/08/16 0354 03/08/16 0619  BP: 124/84 123/88 95/74 125/81  Pulse: 85 (!) 103 86 86  Resp: 18 18 18 18   Temp: 97.7 F (36.5 C) 98.9 F (37.2 C) 97.7 F (36.5 C) 98.2 F (36.8 C)  TempSrc: Oral Oral Oral Oral  SpO2: 99%     Weight:      Height:       General: alert and no distress Lochia: appropriate Uterine Fundus: firm  Labs: Lab Results  Component Value Date   WBC 6.5 03/08/2016   HGB 9.8 (L) 03/08/2016   HCT 30.1 (L)  03/08/2016   MCV 72.0 (L) 03/08/2016   PLT 203 03/08/2016   CMP Latest Ref Rng & Units 01/25/2012  Glucose 70 - 99 mg/dL 81  BUN 6 - 23 mg/dL 5(L)  Creatinine 7.25 - 1.10 mg/dL 3.66  Sodium 440 - 347 mEq/L 139  Potassium 3.5 - 5.1 mEq/L 3.7  Chloride 96 - 112 mEq/L 106  CO2 19 - 32 mEq/L 23  Calcium 8.4 - 10.5 mg/dL 8.7  Total Protein 6.0 - 8.3 g/dL 6.5  Total Bilirubin 0.3 - 1.2 mg/dL 4.2(V)  Alkaline Phos 39 - 117 U/L 135(H)  AST 0 - 37 U/L 21  ALT 0 - 35 U/L 15    Discharge instruction: per After Visit Summary and "Baby and Me Booklet".  After visit meds:  Allergies as of 03/08/2016   No Known Allergies  Medication List    TAKE these medications   ibuprofen 800 MG tablet Commonly known as:  ADVIL,MOTRIN Take 1 tablet (800 mg total) by mouth every 8 (eight) hours as needed.   PREPLUS 27-1 MG Tabs Take 1 tablet by mouth daily. What changed:  See the new instructions.       Diet: routine diet  Activity: Advance as tolerated. Pelvic rest for 6 weeks.   Outpatient follow up:6 weeks Follow up Appt:No future appointments. Follow up Visit:No Follow-up on file.  Postpartum contraception: Undecided  Newborn Data: Live born female  Birth Weight: 7 lb 10.2 oz (3464 g) APGAR: 9, 9  Baby Feeding: Breast Disposition:home with mother   03/08/2016 Sherian ReinBovard-Stuckert, Shriyan Arakawa, MD

## 2016-03-08 NOTE — Progress Notes (Addendum)
Post Partum Day 1 Subjective: no complaints, up ad lib, voiding, tolerating PO and nl lochia, pain controlled  Objective: Blood pressure 125/81, pulse 86, temperature 98.2 F (36.8 C), temperature source Oral, resp. rate 18, height 5\' 6"  (1.676 m), weight 90.7 kg (200 lb), last menstrual period 05/24/2015, SpO2 99 %, unknown if currently breastfeeding.  Physical Exam:  General: alert and no distress Lochia: appropriate Uterine Fundus: firm   Recent Labs  03/07/16 0739 03/08/16 0509  HGB 10.6* 9.8*  HCT 32.7* 30.1*    Assessment/Plan: Plan for discharge tomorrow or today, Breastfeeding and Lactation consult.  Pt desires early discharge - d/c with Motrin and PNV.  F/u 6 weeks   LOS: 1 day   Bovard-Stuckert, Charlene Detter 03/08/2016, 8:39 AM

## 2016-03-08 NOTE — Progress Notes (Signed)
Dr.Bovard notified of patient complaining about her hands hurting from her carpal tunnel. Wrist splints ordered from [facility] will be here this evening.

## 2016-03-09 NOTE — Progress Notes (Signed)
CSW acknowledged consult and meet with [name] in room 108. When CSW arrived, [name] was resting in bed and [name] was on the couch.  [name] gave CSW permission to meet with [name] while [name] was present.  CSW inquired about [name]'s SA hx and [name] appeared offended.  [name] reported that [name] has not utilized any substance in over 5 years. This is also equivalent to what [name] disclosed with CSW ([name]), in 2014.  CSW completed chart review and there was no indication of substance use prenatally.    CSW educated [name] about PPD. CSW informed [name] of possible supports and interventions to decrease PPD.  CSW also encouraged [name] to seek medical attention if needed for increased signs and symptoms for PPD. [name] denied any PPD symptoms with [name]'s oldest daughter. CSW reviewed safe sleep and SIDS. [name] was knowledgeable and asked appropriate questions.  [name] was not acknowledged, however, he was comfortable asking CSW questions. [name] communicated that she has a bassinet for the baby, and feels prepared for the infant.  [name] did not have any further questions, concerns, or needs at this time.  CSW provide with CSW contact information and thanked the family for meeting with CSW.   Diana Flynn, MSW, LCSW Clinical Social Work [phone] 

## 2016-03-09 NOTE — Discharge Instructions (Signed)
As per discharge pamphlet °

## 2016-03-09 NOTE — Progress Notes (Signed)
PPD #2 Doing well, still pain in hands/wrists Afeb, VSS D/c home

## 2017-05-28 IMAGING — US US OB TRANSVAGINAL
1 series · 15 of 28 positions shown · non-contrast
Comparison: None.

CLINICAL DATA: Left lower quadrant intermittent cramping.
Quantitative beta HCG is pending.LMP was05/24/2015. Gestational age
by LMP is7 weeks 6 days. EDC by LMP is02/28/2016.

EXAM:
OBSTETRIC <14 WK ULTRASOUND
TECHNIQUE: Transabdominal ultrasound was performed for evaluation of the
gestation as well as the maternal uterus and adnexal regions.

[Series 1: us ob transvaginal · 15 of 62 slices shown]
[im 1/62]
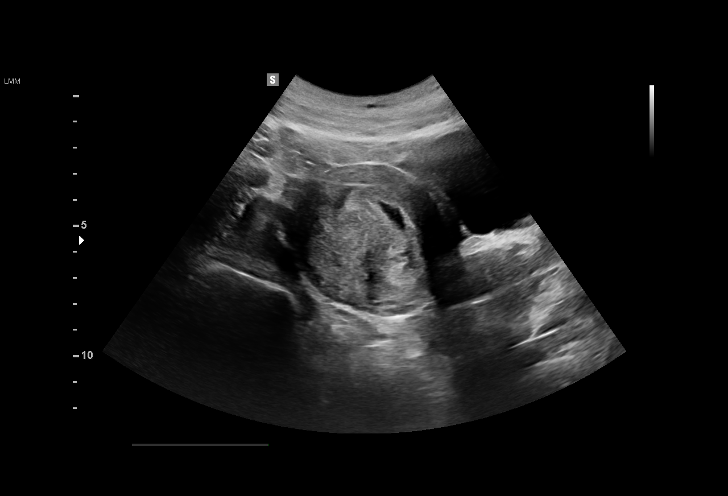
[im 5/62]
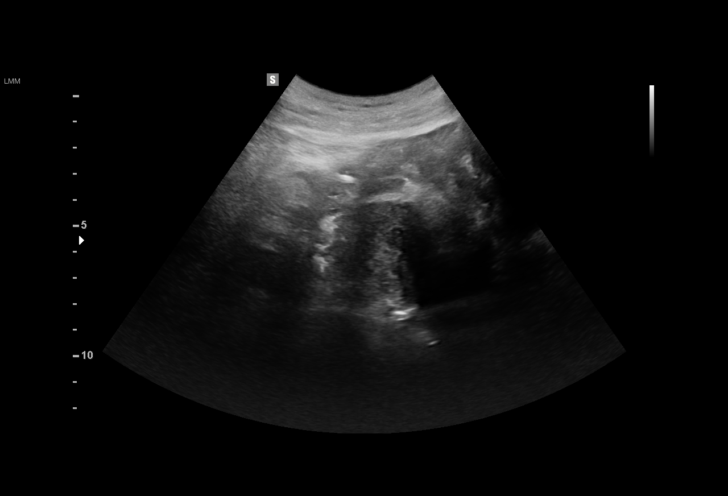
[im 10/62]
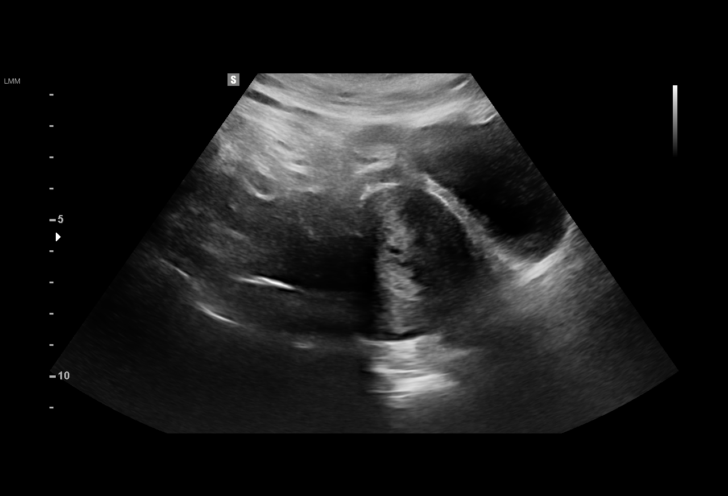
[im 14/62]
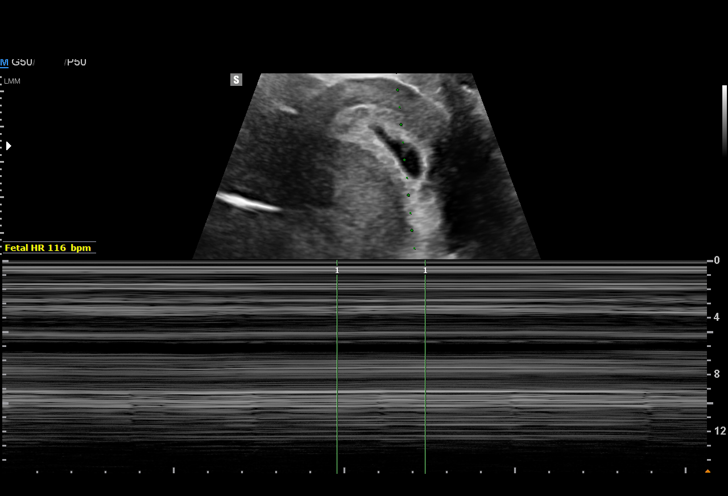
[im 19/62]
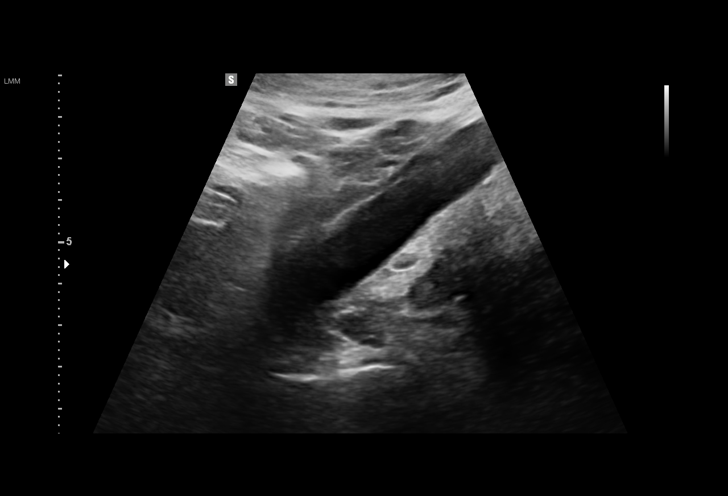
[im 23/62]
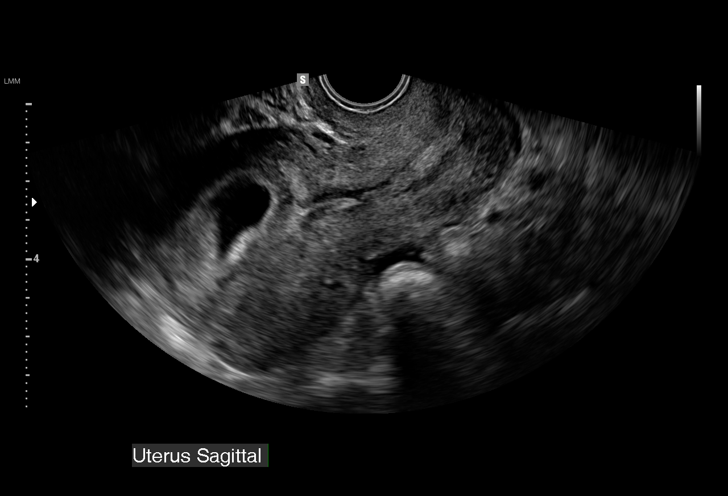
[im 28/62]
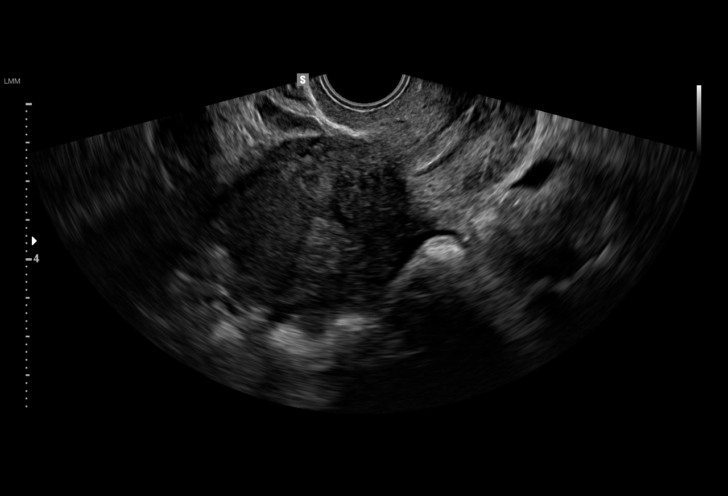
[im 32/62]
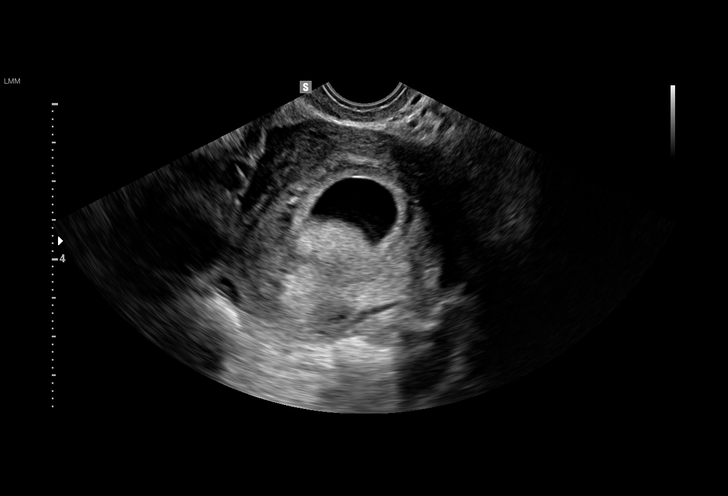
[im 34/62]
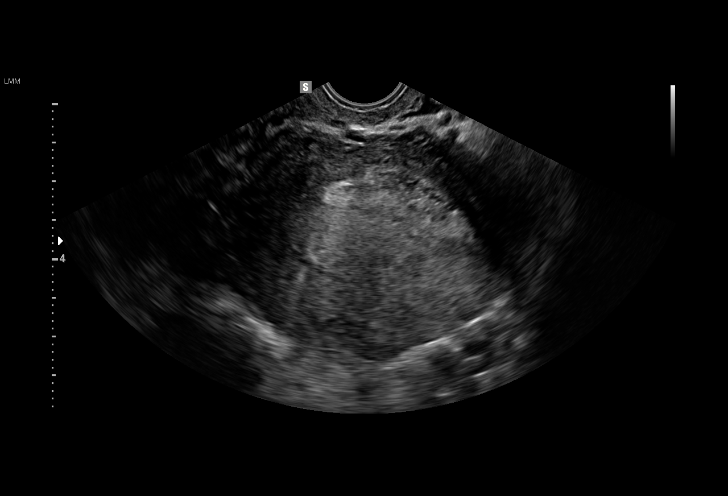
[im 39/62]
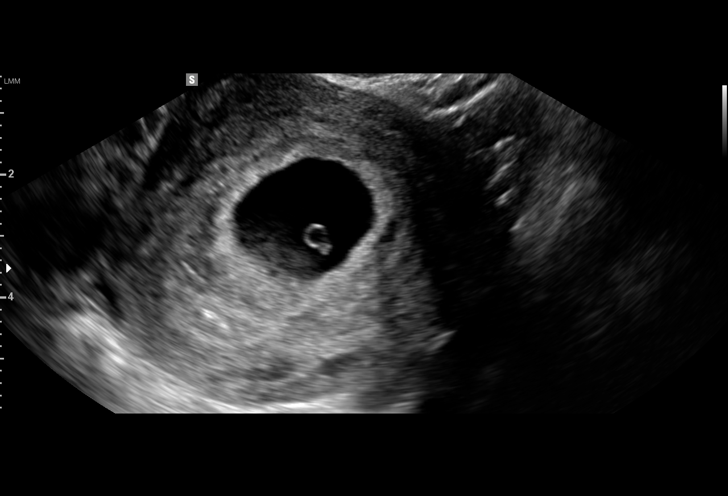
[im 43/62]
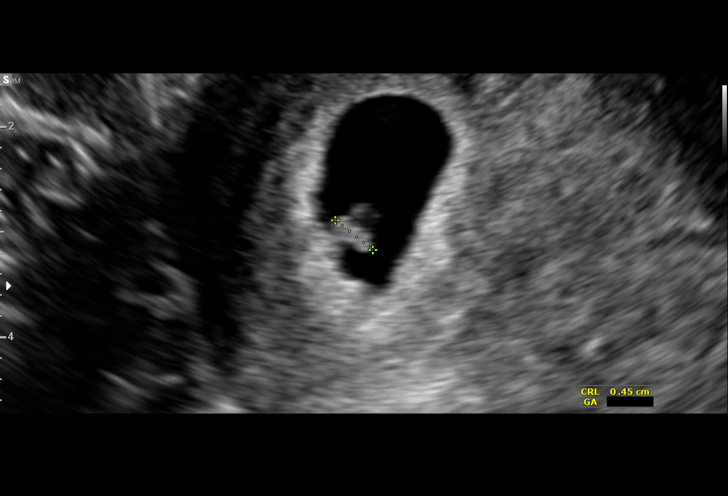
[im 48/62]
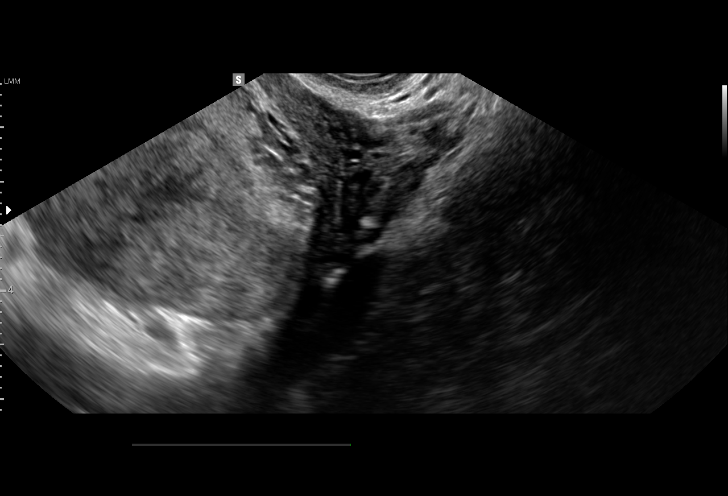
[im 52/62]
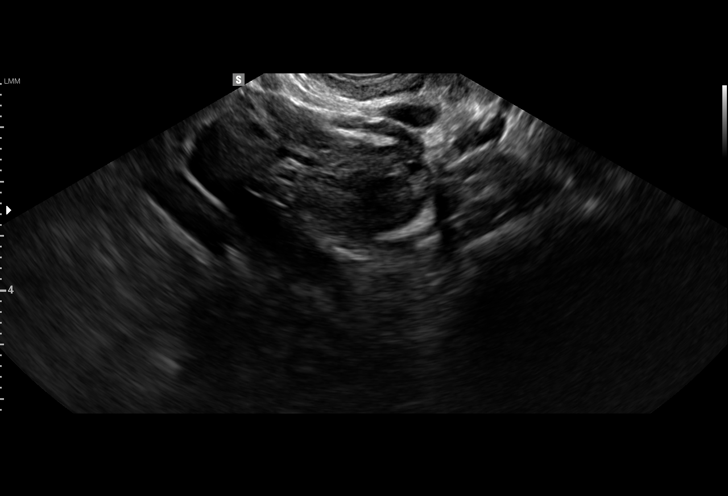
[im 57/62]
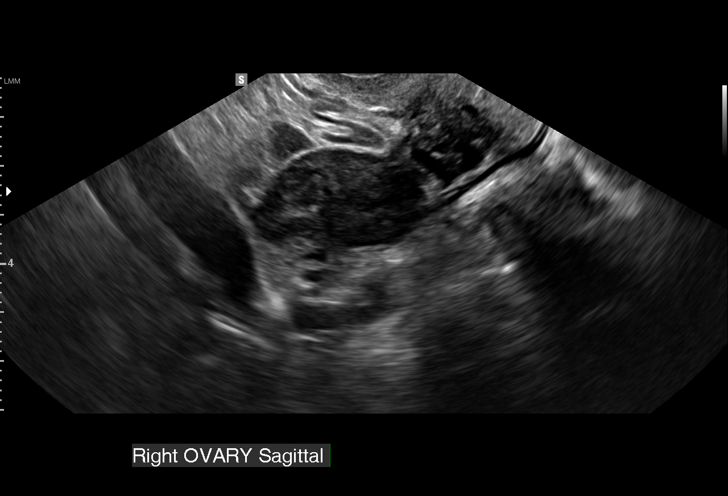
[im 62/62]
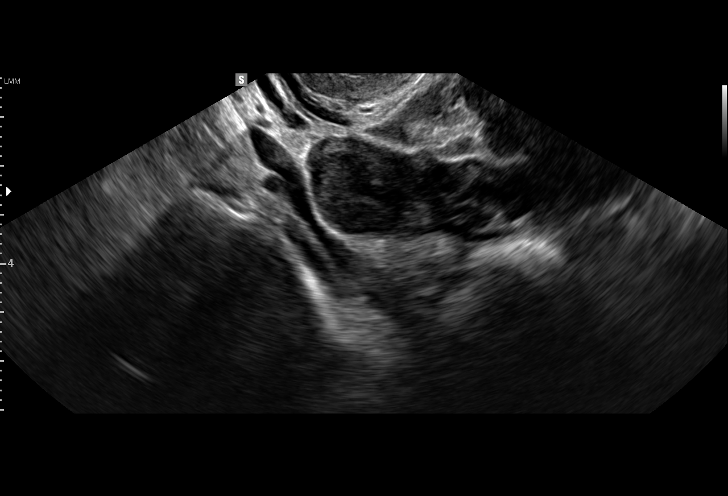

[15 of 28 positions shown; findings below may reference images not displayed]

FINDINGS: Intrauterine gestational sac: Present

Yolk sac:  Present

Embryo:  Present

Cardiac Activity: Present

Heart Rate: 118 bpm

CRL:   4.6  mm   6 w 1 d                  US EDC: 03/11/2016

Subchorionic hemorrhage:  None visualized.

Maternal uterus/adnexae: Normal appearance of the ovaries. A 5 mm
homogeneously hyperechoic nodule is identified within the left ovary
raising question of a small dermoid. No free pelvic fluid.
IMPRESSION: 1. Single living intrauterine embryo.
2. Clinical and ultrasound dating discrepancy. By today's exam EDC
is 03/11/2016.
3. Possible small dermoid within the left ovary.

## 2019-05-28 ENCOUNTER — Ambulatory Visit: Payer: Self-pay | Attending: Internal Medicine

## 2019-05-28 DIAGNOSIS — Z20822 Contact with and (suspected) exposure to covid-19: Secondary | ICD-10-CM

## 2019-05-29 LAB — SARS-COV-2, NAA 2 DAY TAT

## 2019-05-29 LAB — NOVEL CORONAVIRUS, NAA: SARS-CoV-2, NAA: NOT DETECTED

## 2019-07-11 ENCOUNTER — Emergency Department (HOSPITAL_COMMUNITY): Payer: Self-pay

## 2019-07-11 ENCOUNTER — Other Ambulatory Visit: Payer: Self-pay

## 2019-07-11 ENCOUNTER — Emergency Department (HOSPITAL_COMMUNITY)
Admission: EM | Admit: 2019-07-11 | Discharge: 2019-07-11 | Disposition: A | Discharge status: Home / Self Care | Payer: Self-pay | Attending: Emergency Medicine | Admitting: Emergency Medicine

## 2019-07-11 DIAGNOSIS — R2242 Localized swelling, mass and lump, left lower limb: Secondary | ICD-10-CM | POA: Insufficient documentation

## 2019-07-11 DIAGNOSIS — Z5321 Procedure and treatment not carried out due to patient leaving prior to being seen by health care provider: Secondary | ICD-10-CM | POA: Insufficient documentation

## 2019-07-11 DIAGNOSIS — M25572 Pain in left ankle and joints of left foot: Secondary | ICD-10-CM | POA: Insufficient documentation

## 2019-07-11 NOTE — ED Triage Notes (Signed)
Pt fell off of treadmill while trying to turn a fan off last night, has pain and swelling to L ankle. Ambulatory.

## 2019-10-13 ENCOUNTER — Other Ambulatory Visit: Payer: Self-pay

## 2019-10-13 ENCOUNTER — Encounter (HOSPITAL_COMMUNITY): Payer: Self-pay | Admitting: Family Medicine

## 2019-10-13 ENCOUNTER — Inpatient Hospital Stay (HOSPITAL_COMMUNITY)
Admission: AD | Admit: 2019-10-13 | Discharge: 2019-10-13 | Disposition: A | Discharge status: Home / Self Care | Payer: Medicaid Other | Attending: Family Medicine | Admitting: Family Medicine

## 2019-10-13 DIAGNOSIS — Z8619 Personal history of other infectious and parasitic diseases: Secondary | ICD-10-CM | POA: Diagnosis not present

## 2019-10-13 DIAGNOSIS — Z3A01 Less than 8 weeks gestation of pregnancy: Secondary | ICD-10-CM | POA: Diagnosis not present

## 2019-10-13 DIAGNOSIS — O219 Vomiting of pregnancy, unspecified: Secondary | ICD-10-CM

## 2019-10-13 DIAGNOSIS — R110 Nausea: Secondary | ICD-10-CM | POA: Diagnosis not present

## 2019-10-13 DIAGNOSIS — O210 Mild hyperemesis gravidarum: Secondary | ICD-10-CM | POA: Diagnosis not present

## 2019-10-13 DIAGNOSIS — Z87891 Personal history of nicotine dependence: Secondary | ICD-10-CM | POA: Insufficient documentation

## 2019-10-13 DIAGNOSIS — O26891 Other specified pregnancy related conditions, first trimester: Secondary | ICD-10-CM | POA: Insufficient documentation

## 2019-10-13 LAB — POCT PREGNANCY, URINE: Preg Test, Ur: POSITIVE — AB

## 2019-10-13 MED ORDER — METOCLOPRAMIDE HCL 10 MG PO TABS: 10.0000 mg | ORAL_TABLET | Freq: Three times a day (TID) | ORAL | 1 refills | Status: DC

## 2019-10-13 NOTE — MAU Note (Signed)
Presents with c/o nausea, states not vomiting.  States doesn't have appetite, not eating well.  Requesting nausea medication.

## 2019-10-13 NOTE — Discharge Instructions (Signed)

## 2019-10-13 NOTE — Progress Notes (Signed)
MSE performed by Blanche East, NP.  POC and follow up discussed.

## 2019-10-13 NOTE — MAU Provider Note (Signed)
Chief Complaint: Nausea   None     SUBJECTIVE HPI: Diana Flynn is a 39 y.o. [id number] who presents to maternity admissions for nausea. She is early pregnant, @ [redacted]w[redacted]d. She is planning to start care with [facility]. She has no vomiting, no pain, no bleeding. She is interested in starting medication for nausea. She has not tried anything OTC.   Past Medical History:  Diagnosis Date  . Hx of varicella   . Infection    UTI  . Normal pregnancy, first 01/25/2012  . SVD (spontaneous vaginal delivery) 01/25/2012   Past Surgical History:  Procedure Laterality Date  . INNER EAR SURGERY     Social History   Socioeconomic History  . Marital status: Single    Spouse name: Not on file  . Number of children: Not on file  . Years of education: Not on file  . Highest education level: Not on file  Occupational History  . Not on file  Tobacco Use  . Smoking status: Former Smoker    Types: Cigarettes  . Smokeless tobacco: Never Used  . Tobacco comment: quit early preg  Substance and Sexual Activity  . Alcohol use: Yes    Comment: occ  . Drug use: Yes    Types: Marijuana    Comment: not in over a month  . Sexual activity: Yes  Other Topics Concern  . Not on file  Social History Narrative  . Not on file   Social Determinants of Health   Financial Resource Strain:   . Difficulty of Paying Living Expenses: Not on file  Food Insecurity:   . Worried About Programme researcher, broadcasting/film/video in the Last Year: Not on file  . Ran Out of Food in the Last Year: Not on file  Transportation Needs:   . Lack of Transportation (Medical): Not on file  . Lack of Transportation (Non-Medical): Not on file  Physical Activity:   . Days of Exercise per Week: Not on file  . Minutes of Exercise per Session: Not on file  Stress:   . Feeling of Stress : Not on file  Social Connections:   . Frequency of Communication with Friends and Family: Not on file  . Frequency of Social Gatherings with Friends and Family:  Not on file  . Attends Religious Services: Not on file  . Active Member of Clubs or Organizations: Not on file  . Attends Banker Meetings: Not on file  . Marital Status: Not on file  Intimate Partner Violence:   . Fear of Current or Ex-Partner: Not on file  . Emotionally Abused: Not on file  . Physically Abused: Not on file  . Sexually Abused: Not on file   No current facility-administered medications on file prior to encounter.   Current Outpatient Medications on File Prior to Encounter  Medication Sig Dispense Refill  . ibuprofen (ADVIL,MOTRIN) 800 MG tablet Take 1 tablet (800 mg total) by mouth every 8 (eight) hours as needed. 45 tablet 1  . Prenatal Vit-Fe Fumarate-FA (PREPLUS) 27-1 MG TABS Take 1 tablet by mouth daily. 30 tablet 12   No Known Allergies   Results for orders placed or performed during the hospital encounter of 10/13/19 (from the past 48 hour(s))  Pregnancy, urine POC     Status: Abnormal   Collection Time: 10/13/19 12:37 PM  Result Value Ref Range   Preg Test, Ur POSITIVE (A) NEGATIVE    Comment:        THE SENSITIVITY OF  THIS METHODOLOGY IS >24 mIU/mL     ROS:  Review of Systems  Constitutional: Positive for appetite change.  Gastrointestinal: Positive for nausea. Negative for abdominal pain and vomiting.  Genitourinary: Negative for vaginal bleeding.   I have reviewed patient's Past Medical Hx, Surgical Hx, Family Hx, Social Hx, medications and allergies.   Physical Exam   Patient Vitals for the past 24 hrs:  BP Temp Temp src Pulse Resp SpO2 Height Weight  10/13/19 1243 123/85 98.5 F (36.9 C) Oral 94 20 99 % -- --  10/13/19 1238 -- -- -- -- -- -- 5\' 5"  (1.651 m) 77.7 kg   Physical Exam Constitutional:      General: She is not in acute distress.    Appearance: Normal appearance. She is not ill-appearing, toxic-appearing or diaphoretic.  HENT:     Head: Normocephalic.  Neurological:     Mental Status: She is alert and oriented  to person, place, and time.  Psychiatric:        Behavior: Behavior normal.    MDM Patient denies any concerning symptoms in need of emergent evaluation. MSE done.  ASSESSMENT MSE Complete Nausea in pregnancy.   PLAN Discharge patient Rx: Reglan to use while working Unisome/B6 Start prenatal care Return to MAU if symptoms worsen.   I, NP 10/13/2019 1:12 PM

## 2019-10-17 ENCOUNTER — Other Ambulatory Visit: Payer: Self-pay

## 2019-10-17 ENCOUNTER — Inpatient Hospital Stay (HOSPITAL_COMMUNITY)
Admission: AD | Admit: 2019-10-17 | Discharge: 2019-10-17 | Disposition: A | Discharge status: Home / Self Care | Payer: Medicaid Other | Attending: Obstetrics and Gynecology | Admitting: Obstetrics and Gynecology

## 2019-10-17 ENCOUNTER — Encounter (HOSPITAL_COMMUNITY): Payer: Self-pay | Admitting: Obstetrics and Gynecology

## 2019-10-17 DIAGNOSIS — O09521 Supervision of elderly multigravida, first trimester: Secondary | ICD-10-CM | POA: Diagnosis not present

## 2019-10-17 DIAGNOSIS — Z833 Family history of diabetes mellitus: Secondary | ICD-10-CM | POA: Insufficient documentation

## 2019-10-17 DIAGNOSIS — Z8249 Family history of ischemic heart disease and other diseases of the circulatory system: Secondary | ICD-10-CM | POA: Insufficient documentation

## 2019-10-17 DIAGNOSIS — Z3A01 Less than 8 weeks gestation of pregnancy: Secondary | ICD-10-CM | POA: Insufficient documentation

## 2019-10-17 DIAGNOSIS — O210 Mild hyperemesis gravidarum: Secondary | ICD-10-CM | POA: Diagnosis not present

## 2019-10-17 DIAGNOSIS — Z87891 Personal history of nicotine dependence: Secondary | ICD-10-CM | POA: Insufficient documentation

## 2019-10-17 DIAGNOSIS — O219 Vomiting of pregnancy, unspecified: Secondary | ICD-10-CM

## 2019-10-17 LAB — URINALYSIS, ROUTINE W REFLEX MICROSCOPIC
Bilirubin Urine: NEGATIVE
Glucose, UA: NEGATIVE mg/dL
Hgb urine dipstick: NEGATIVE
Ketones, ur: NEGATIVE mg/dL
Leukocytes,Ua: NEGATIVE
Nitrite: NEGATIVE
Protein, ur: NEGATIVE mg/dL
Specific Gravity, Urine: 1.016 (ref 1.005–1.030)
pH: 7 (ref 5.0–8.0)

## 2019-10-17 MED ORDER — CYPROHEPTADINE HCL 4 MG PO TABS: 4.0000 mg | ORAL_TABLET | Freq: Two times a day (BID) | ORAL | 0 refills | Status: DC | PRN

## 2019-10-17 MED ORDER — PROMETHAZINE HCL 25 MG PO TABS
25.0000 mg | ORAL_TABLET | Freq: Once | ORAL | Status: AC
  Administered 2019-10-17: 25 mg via ORAL
  Filled 2019-10-17: qty 1

## 2019-10-17 MED ORDER — PROMETHAZINE HCL 25 MG PO TABS: 12.5000 mg | ORAL_TABLET | Freq: Four times a day (QID) | ORAL | 2 refills | Status: DC | PRN

## 2019-10-17 NOTE — Discharge Instructions (Signed)
Morning Sickness ° °Morning sickness is when you feel sick to your stomach (nauseous) during pregnancy. You may feel sick to your stomach and throw up (vomit). You may feel sick in the morning, but you can feel this way at any time of day. Some women feel very sick to their stomach and cannot stop throwing up (hyperemesis gravidarum). °Follow these instructions at home: °Medicines °· Take over-the-counter and prescription medicines only as told by your doctor. Do not take any medicines until you talk with your doctor about them first. °· Taking multivitamins before getting pregnant can stop or lessen the harshness of morning sickness. °Eating and drinking °· Eat dry toast or crackers before getting out of bed. °· Eat 5 or 6 small meals a day. °· Eat dry and bland foods like rice and baked potatoes. °· Do not eat greasy, fatty, or spicy foods. °· Have someone cook for you if the smell of food causes you to feel sick or throw up. °· If you feel sick to your stomach after taking prenatal vitamins, take them at night or with a snack. °· Eat protein when you need a snack. Nuts, yogurt, and cheese are good choices. °· Drink fluids throughout the day. °· Try ginger ale made with real ginger, ginger tea made from fresh grated ginger, or ginger candies. °General instructions °· Do not use any products that have nicotine or tobacco in them, such as cigarettes and e-cigarettes. If you need help quitting, ask your doctor. °· Use an air purifier to keep the air in your house free of smells. °· Get lots of fresh air. °· Try to avoid smells that make you feel sick. °· Try: °? Wearing a bracelet that is used for seasickness (acupressure wristband). °? Going to a doctor who puts thin needles into certain body points (acupuncture) to improve how you feel. °Contact a doctor if: °· You need medicine to feel better. °· You feel dizzy or light-headed. °· You are losing weight. °Get help right away if: °· You feel very sick to your  stomach and cannot stop throwing up. °· You pass out (faint). °· You have very bad pain in your belly. °Summary °· Morning sickness is when you feel sick to your stomach (nauseous) during pregnancy. °· You may feel sick in the morning, but you can feel this way at any time of day. °· Making some changes to what you eat may help your symptoms go away. °This information is not intended to replace advice given to you by your health care provider. Make sure you discuss any questions you have with your health care provider. °Document Revised: 12/01/2016 Document Reviewed: 01/20/2016 °Elsevier Patient Education © 2020 [employer] ° °

## 2019-10-17 NOTE — MAU Provider Note (Signed)
History     CSN: [id number]  Arrival date and time: 10/17/19 1229   First Provider Initiated Contact with Patient 10/17/19 1329      Chief Complaint  Patient presents with  . Morning Sickness   Diana Flynn is a 39 y.o. [id number] at [redacted]w[redacted]d who presents today with nausea and not being able to eat. She is not having any vomiting. She was given Reglan and B6 on 10/13/2019. She states that she has been taking these medications, but she is still nauseous. She states that she is not vomiting because she does not eat, but that when she does eat or drink she does not vomiting.  Emesis  This is a new problem. The current episode started 1 to 4 weeks ago. Episode frequency: No vomiting  The problem has been unchanged. There has been no fever. Pertinent negatives include no chills, diarrhea or fever. Risk factors: pregnancy  Treatments tried: Reglan and B6. The treatment provided no relief.    OB History    Gravida  5   Para  2   Term  2   Preterm  0   AB  2   Living  2     SAB  0   TAB  2   Ectopic  0   Multiple  0   Live Births  2        Obstetric Comments  TAB with medication         Past Medical History:  Diagnosis Date  . Hx of varicella   . Infection    UTI  . Normal pregnancy, first 01/25/2012  . SVD (spontaneous vaginal delivery) 01/25/2012    Past Surgical History:  Procedure Laterality Date  . INNER EAR SURGERY      Family History  Problem Relation Age of Onset  . Diabetes Mother   . Diabetes Maternal Grandmother   . Hypertension Maternal Grandmother   . Cancer Maternal Aunt     Social History   Tobacco Use  . Smoking status: Former Smoker    Types: Cigarettes  . Smokeless tobacco: Never Used  . Tobacco comment: quit early preg  Substance Use Topics  . Alcohol use: Yes    Comment: occ  . Drug use: Yes    Types: Marijuana    Comment: not in over a month    Allergies: No Known Allergies  Medications Prior to Admission  Medication Sig  Dispense Refill Last Dose  . metoCLOPramide (REGLAN) 10 MG tablet Take 1 tablet (10 mg total) by mouth 4 (four) times daily -  before meals and at bedtime. 60 tablet 1 10/17/2019 at Unknown time  . Prenatal Vit-Fe Fumarate-FA (PREPLUS) 27-1 MG TABS Take 1 tablet by mouth daily. 30 tablet 12     Review of Systems  Constitutional: Negative for chills and fever.  Gastrointestinal: Positive for nausea. Negative for constipation, diarrhea and vomiting.  Genitourinary: Negative for dysuria, pelvic pain, vaginal bleeding and vaginal discharge.   Physical Exam   Blood pressure (!) 135/96, pulse 86, temperature 97.6 F (36.4 C), resp. rate 18, weight 77.1 kg, last menstrual period 08/30/2019, SpO2 99 %, unknown if currently breastfeeding.  Physical Exam Vitals and nursing note reviewed.  Constitutional:      General: She is not in acute distress.    Appearance: Normal appearance.  HENT:     Head: Normocephalic.  Cardiovascular:     Rate and Rhythm: Normal rate.  Pulmonary:     Effort: Pulmonary effort is  normal.  Abdominal:     General: There is no distension.     Palpations: Abdomen is soft.  Skin:    General: Skin is warm and dry.  Neurological:     Mental Status: She is alert and oriented to person, place, and time.  Psychiatric:        Mood and Affect: Mood normal.        Behavior: Behavior normal.      Results for orders placed or performed during the hospital encounter of 10/17/19 (from the past 24 hour(s))  Urinalysis, Routine w reflex microscopic Urine, Clean Catch     Status: Abnormal   Collection Time: 10/17/19 12:32 PM  Result Value Ref Range   Color, Urine YELLOW YELLOW   APPearance HAZY (A) CLEAR   Specific Gravity, Urine 1.016 1.005 - 1.030   pH 7.0 5.0 - 8.0   Glucose, UA NEGATIVE NEGATIVE mg/dL   Hgb urine dipstick NEGATIVE NEGATIVE   Bilirubin Urine NEGATIVE NEGATIVE   Ketones, ur NEGATIVE NEGATIVE mg/dL   Protein, ur NEGATIVE NEGATIVE mg/dL   Nitrite  NEGATIVE NEGATIVE   Leukocytes,Ua NEGATIVE NEGATIVE    MAU Course  Procedures  MDM Patient has had 25mg  phenergan PO. She reports that she is feeling better and can tolerate eating crackers.   Assessment and Plan   1. Nausea and vomiting in pregnancy   2. [redacted] weeks gestation of pregnancy    DC home Comfort measures reviewed  1st Trimester precautions  RX: phenergan PRN #30, Periactin 4mg  BID as needed to stimulate appetite  Return to MAU as needed FU with OB as planned   Follow-up Information    [facility] Follow up.   Contact information: [address]  [address] [place] [address] [place] [phone]                 [id number] DNP, CNM  10/17/19  3:06 PM

## 2019-10-17 NOTE — MAU Note (Signed)
Pt reports only nausea, not vomiting. Says she feels nausea and the thought of eating makes her feel more nauseated.  Can tolerate ginger ale and slushy, but not feeling like eating.

## 2019-10-17 NOTE — MAU Note (Signed)
.   Diana Flynn is a 39 y.o. at [redacted]w[redacted]d here in MAU reporting: nausea and vomiting. Pt states she was here on Monday and given reglan and vit B6 and it is not working. Denies any Vb or pain at present LMP: 08/30/19 Onset of complaint: ongoing Pain score: 0 Vitals:   10/17/19 1250  BP: (!) 135/96  Pulse: 86  Resp: 18  Temp: 97.6 F (36.4 C)  SpO2: 99%     FHT: Lab orders placed from triage: [facility]A

## 2019-12-17 ENCOUNTER — Emergency Department (HOSPITAL_COMMUNITY)
Admission: EM | Admit: 2019-12-17 | Discharge: 2019-12-17 | Disposition: A | Discharge status: Home / Self Care | Payer: Medicaid Other | Attending: Emergency Medicine | Admitting: Emergency Medicine

## 2019-12-17 ENCOUNTER — Other Ambulatory Visit: Payer: Self-pay

## 2019-12-17 ENCOUNTER — Encounter (HOSPITAL_COMMUNITY): Payer: Self-pay | Admitting: *Deleted

## 2019-12-17 DIAGNOSIS — K0889 Other specified disorders of teeth and supporting structures: Secondary | ICD-10-CM | POA: Diagnosis not present

## 2019-12-17 DIAGNOSIS — Z87891 Personal history of nicotine dependence: Secondary | ICD-10-CM | POA: Insufficient documentation

## 2019-12-17 MED ORDER — TRAMADOL HCL 50 MG PO TABS: 50.0000 mg | ORAL_TABLET | Freq: Four times a day (QID) | ORAL | 0 refills | Status: DC | PRN

## 2019-12-17 NOTE — ED Provider Notes (Signed)
[facility] Provider Note   CSN: [id number] Arrival date & time: 12/17/19  1205     History Chief Complaint  Patient presents with  . Dental Pain    Lower left side    Diana Flynn is a 39 y.o. female.  39 year old female presents with several month history of left lower tooth pain.  Scheduled to see a dentist next week for same.  Denies any intraoral drainage.  No fever or chills.  No jaw swelling.  Has been using Tylenol Motrin without relief.        Past Medical History:  Diagnosis Date  . Hx of varicella   . Infection    UTI  . Normal pregnancy, first 01/25/2012  . SVD (spontaneous vaginal delivery) 01/25/2012    Patient Active Problem List   Diagnosis Date Noted  . Indication for care in labor and delivery, antepartum 03/07/2016  . NSVD (normal spontaneous vaginal delivery) 03/07/2016    Past Surgical History:  Procedure Laterality Date  . INNER EAR SURGERY       OB History    Gravida  5   Para  2   Term  2   Preterm  0   AB  2   Living  2     SAB  0   IAB  2   Ectopic  0   Multiple  0   Live Births  2        Obstetric Comments  TAB with medication         Family History  Problem Relation Age of Onset  . Diabetes Mother   . Diabetes Maternal Grandmother   . Hypertension Maternal Grandmother   . Cancer Maternal Aunt     Social History   Tobacco Use  . Smoking status: Former Smoker    Types: Cigarettes  . Smokeless tobacco: Never Used  . Tobacco comment: quit early preg  Vaping Use  . Vaping Use: Never used  Substance Use Topics  . Alcohol use: Yes    Comment: occ  . Drug use: Yes    Types: Marijuana    Comment: not in over a month    Home Medications Prior to Admission medications   Medication Sig Start Date End Date Taking? Authorizing Provider  cyproheptadine (PERIACTIN) 4 MG tablet Take 1 tablet (4 mg total) by mouth 2 (two) times daily as needed. 10/17/19   Thressa Sheller  D, CNM  Prenatal Vit-Fe Fumarate-FA (PREPLUS) 27-1 MG TABS Take 1 tablet by mouth daily. 03/08/16   Bovard-Stuckert, Augusto Gamble, MD  promethazine (PHENERGAN) 25 MG tablet Take 0.5-1 tablets (12.5-25 mg total) by mouth every 6 (six) hours as needed for nausea or vomiting. 10/17/19   Armando Reichert, CNM    Allergies    Patient has no known allergies.  Review of Systems   Review of Systems  All other systems reviewed and are negative.   Physical Exam Updated Vital Signs BP (!) 148/104 (BP Location: Left Arm)   Pulse 76   Temp 97.9 F (36.6 C) (Oral)   Resp 16   Ht 1.676 m (5\' 6" )   Wt 77.6 kg   LMP 08/30/2019 (Approximate)   SpO2 99%   Breastfeeding Unknown   BMI 27.60 kg/m   Physical Exam Vitals and nursing note reviewed.  Constitutional:      General: She is not in acute distress.    Appearance: Normal appearance. She is well-developed and well-nourished. She is not toxic-appearing.  HENT:     Head: Normocephalic and atraumatic.     Mouth/Throat:   Eyes:     General: Lids are normal.     Extraocular Movements: EOM normal.     Conjunctiva/sclera: Conjunctivae normal.     Pupils: Pupils are equal, round, and reactive to light.  Neck:     Thyroid: No thyroid mass.     Trachea: No tracheal deviation.  Cardiovascular:     Rate and Rhythm: Normal rate and regular rhythm.     Heart sounds: Normal heart sounds. No murmur heard. No gallop.   Pulmonary:     Effort: Pulmonary effort is normal. No respiratory distress.     Breath sounds: Normal breath sounds. No stridor. No decreased breath sounds, wheezing, rhonchi or rales.  Abdominal:     General: Bowel sounds are normal. There is no distension.     Palpations: Abdomen is soft.     Tenderness: There is no abdominal tenderness. There is no CVA tenderness or rebound.  Musculoskeletal:        General: No tenderness or edema. Normal range of motion.     Cervical back: Normal range of motion and neck supple.  Skin:    General:  Skin is warm and dry.     Findings: No abrasion or rash.  Neurological:     Mental Status: She is alert and oriented to person, place, and time.     GCS: GCS eye subscore is 4. GCS verbal subscore is 5. GCS motor subscore is 6.     Cranial Nerves: No cranial nerve deficit.     Sensory: No sensory deficit.     Deep Tendon Reflexes: Strength normal.  Psychiatric:        Mood and Affect: Mood and affect normal.        Speech: Speech normal.        Behavior: Behavior normal.     ED Results / Procedures / Treatments   Labs (all labs ordered are listed, but only abnormal results are displayed) Labs Reviewed - No data to display  EKG None  Radiology No results found.  Procedures Procedures (including critical care time)  Medications Ordered in ED Medications - No data to display  ED Course  I have reviewed the triage vital signs and the nursing notes.  Pertinent labs & imaging results that were available during my care of the patient were reviewed by me and considered in my medical decision making (see chart for details).    MDM Rules/Calculators/A&P                          No evidence of dental infection this time.  Appears to have an impacted tooth.  Will prescribe Ultram for pain and have encouraged patient follow-up with her doctor Final Clinical Impression(s) / ED Diagnoses Final diagnoses:  None    Rx / DC Orders ED Discharge Orders    None       Lorre Nick, MD 12/17/19 1346

## 2019-12-17 NOTE — ED Triage Notes (Signed)
Pt reports dental pain to lower left jaw x 2 months.  Pain has been getting progressively worse and today it is constant.  Pt has a an appt with dentist on thursdays but requesting some pain relief today.

## 2019-12-17 NOTE — ED Notes (Signed)
Pt stated room is too cold for her. Pt stated she needs another room, there are no other fast track rooms available, there are no other ED rooms available. Pt given warm blanket.

## 2019-12-17 NOTE — ED Notes (Signed)
An After Visit Summary was printed and given to the patient. Discharge instructions given and no further questions at this time.  

## 2019-12-30 DIAGNOSIS — Z3201 Encounter for pregnancy test, result positive: Secondary | ICD-10-CM | POA: Diagnosis not present

## 2020-01-22 ENCOUNTER — Telehealth: Payer: Self-pay | Admitting: General Practice

## 2020-01-22 NOTE — Telephone Encounter (Signed)
I attempted to reach Ms.Newsom to get her scheduled for a telephone appt with the High Risk Managed Medicaid Team. I left my name and number for her to return my call. I will reach out again in 7-14 days if I have not heard back from her.

## 2020-03-22 ENCOUNTER — Other Ambulatory Visit: Payer: Self-pay

## 2020-03-22 ENCOUNTER — Inpatient Hospital Stay (HOSPITAL_COMMUNITY)
Admission: AD | Admit: 2020-03-22 | Discharge: 2020-03-22 | Disposition: A | Discharge status: Home / Self Care | Payer: Medicaid Other | Attending: Family Medicine | Admitting: Family Medicine

## 2020-03-22 DIAGNOSIS — Z3202 Encounter for pregnancy test, result negative: Secondary | ICD-10-CM | POA: Diagnosis not present

## 2020-03-22 LAB — POCT PREGNANCY, URINE: Preg Test, Ur: NEGATIVE

## 2020-03-22 LAB — HCG, QUANTITATIVE, PREGNANCY: hCG, Beta Chain, Quant, S: 3 m[IU]/mL (ref <5)

## 2020-03-22 NOTE — MAU Note (Signed)
Presents with c/o VB and lower abdominal pain that began yesterday.  States took HPT x2 , both positive.  LMP 03/01/2020

## 2020-04-15 ENCOUNTER — Telehealth: Payer: Self-pay

## 2020-04-15 NOTE — Telephone Encounter (Signed)
Third attempt made today to reach Diana Flynn to get her scheduled with the Managed Medicaid team. Unable to leave a message.

## 2021-03-09 DIAGNOSIS — R87810 Cervical high risk human papillomavirus (HPV) DNA test positive: Secondary | ICD-10-CM | POA: Diagnosis not present

## 2021-03-09 DIAGNOSIS — Z3169 Encounter for other general counseling and advice on procreation: Secondary | ICD-10-CM | POA: Diagnosis not present

## 2021-03-09 DIAGNOSIS — Z202 Contact with and (suspected) exposure to infections with a predominantly sexual mode of transmission: Secondary | ICD-10-CM | POA: Diagnosis not present

## 2021-03-09 DIAGNOSIS — I10 Essential (primary) hypertension: Secondary | ICD-10-CM | POA: Diagnosis not present

## 2021-03-09 DIAGNOSIS — Z30011 Encounter for initial prescription of contraceptive pills: Secondary | ICD-10-CM | POA: Diagnosis not present

## 2021-03-09 DIAGNOSIS — Z130 Encounter for screening for diseases of the blood and blood-forming organs and certain disorders involving the immune mechanism: Secondary | ICD-10-CM | POA: Diagnosis not present

## 2021-03-09 DIAGNOSIS — Z124 Encounter for screening for malignant neoplasm of cervix: Secondary | ICD-10-CM | POA: Diagnosis not present

## 2021-03-09 DIAGNOSIS — Z1389 Encounter for screening for other disorder: Secondary | ICD-10-CM | POA: Diagnosis not present

## 2021-03-09 DIAGNOSIS — Z1151 Encounter for screening for human papillomavirus (HPV): Secondary | ICD-10-CM | POA: Diagnosis not present

## 2021-03-09 DIAGNOSIS — Z01411 Encounter for gynecological examination (general) (routine) with abnormal findings: Secondary | ICD-10-CM | POA: Diagnosis not present

## 2021-04-22 DIAGNOSIS — Z113 Encounter for screening for infections with a predominantly sexual mode of transmission: Secondary | ICD-10-CM | POA: Diagnosis not present

## 2021-04-22 DIAGNOSIS — B3731 Acute candidiasis of vulva and vagina: Secondary | ICD-10-CM | POA: Diagnosis not present

## 2021-04-22 DIAGNOSIS — N898 Other specified noninflammatory disorders of vagina: Secondary | ICD-10-CM | POA: Diagnosis not present

## 2021-04-22 DIAGNOSIS — N939 Abnormal uterine and vaginal bleeding, unspecified: Secondary | ICD-10-CM | POA: Diagnosis not present

## 2021-05-21 IMAGING — CR DG ANKLE COMPLETE 3+V*L*
3 series · 3 of 3 positions shown · non-contrast
Comparison: None.

CLINICAL DATA: Pain and swelling.  Fall yesterday.

EXAM:
LEFT ANKLE COMPLETE - 3+ VIEW

[ankle ap]
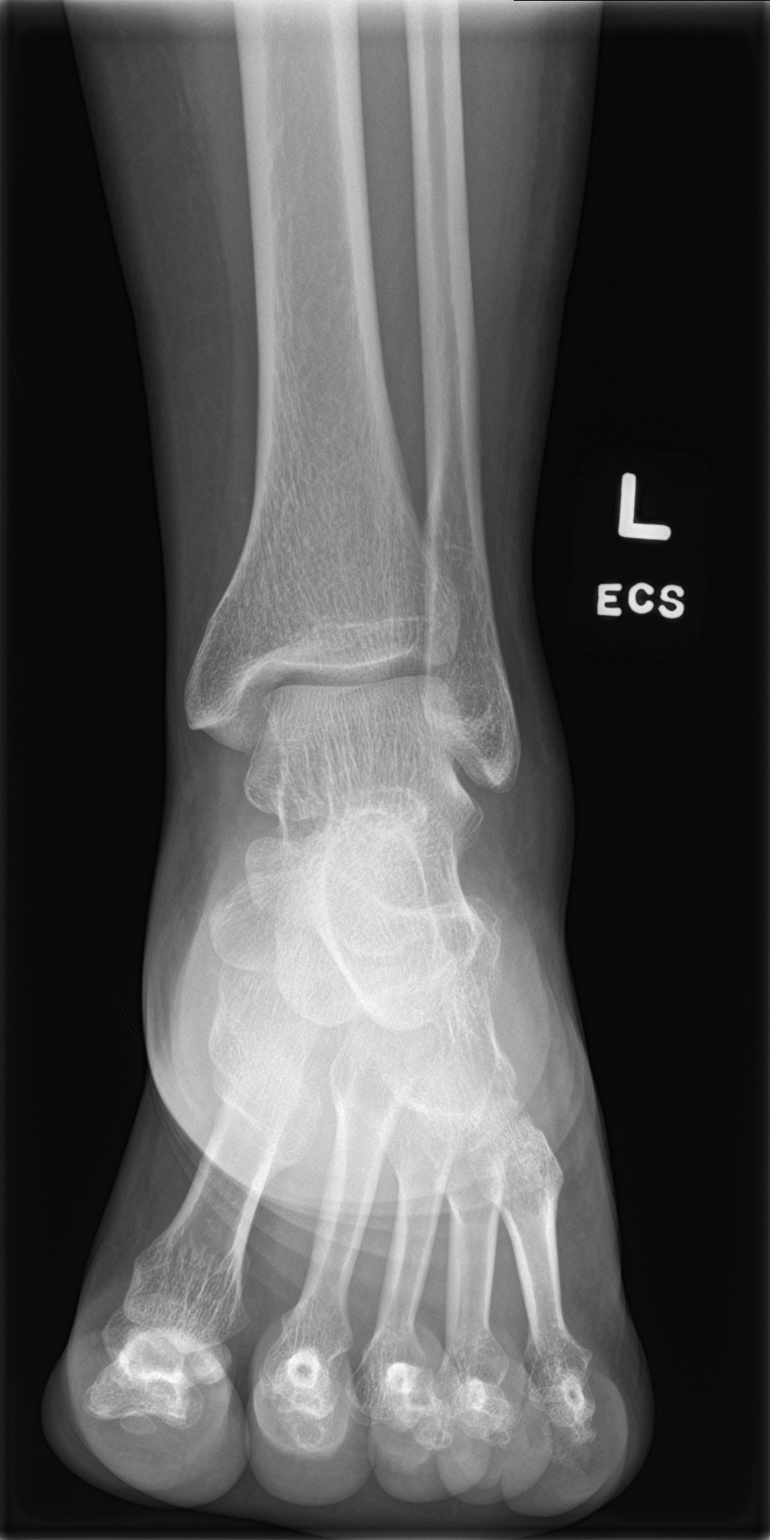

[ankle obl]
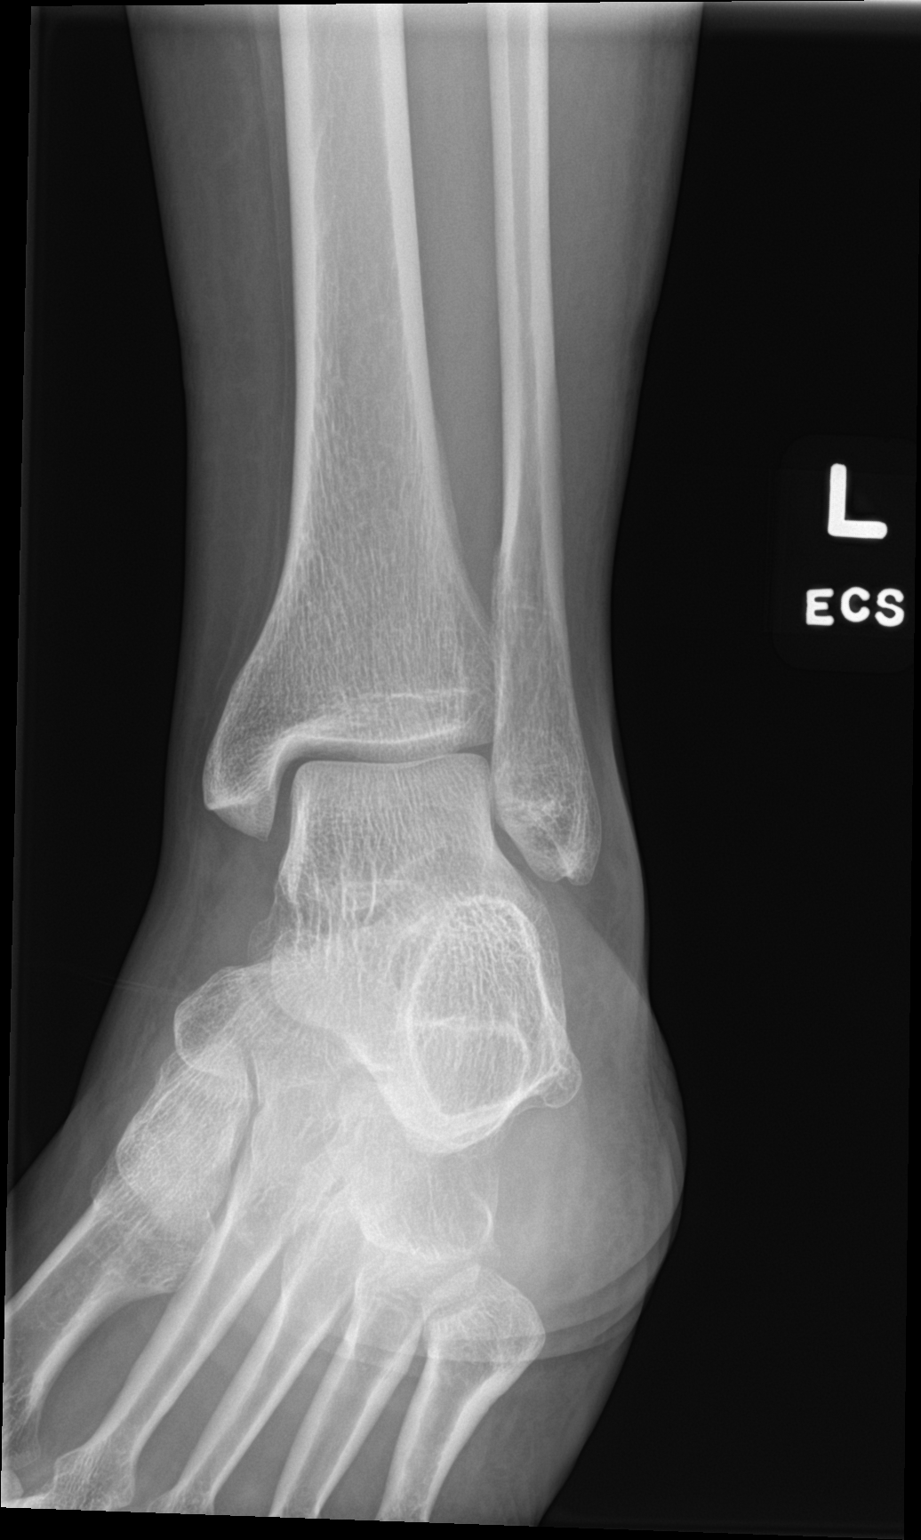

[ankle lat]
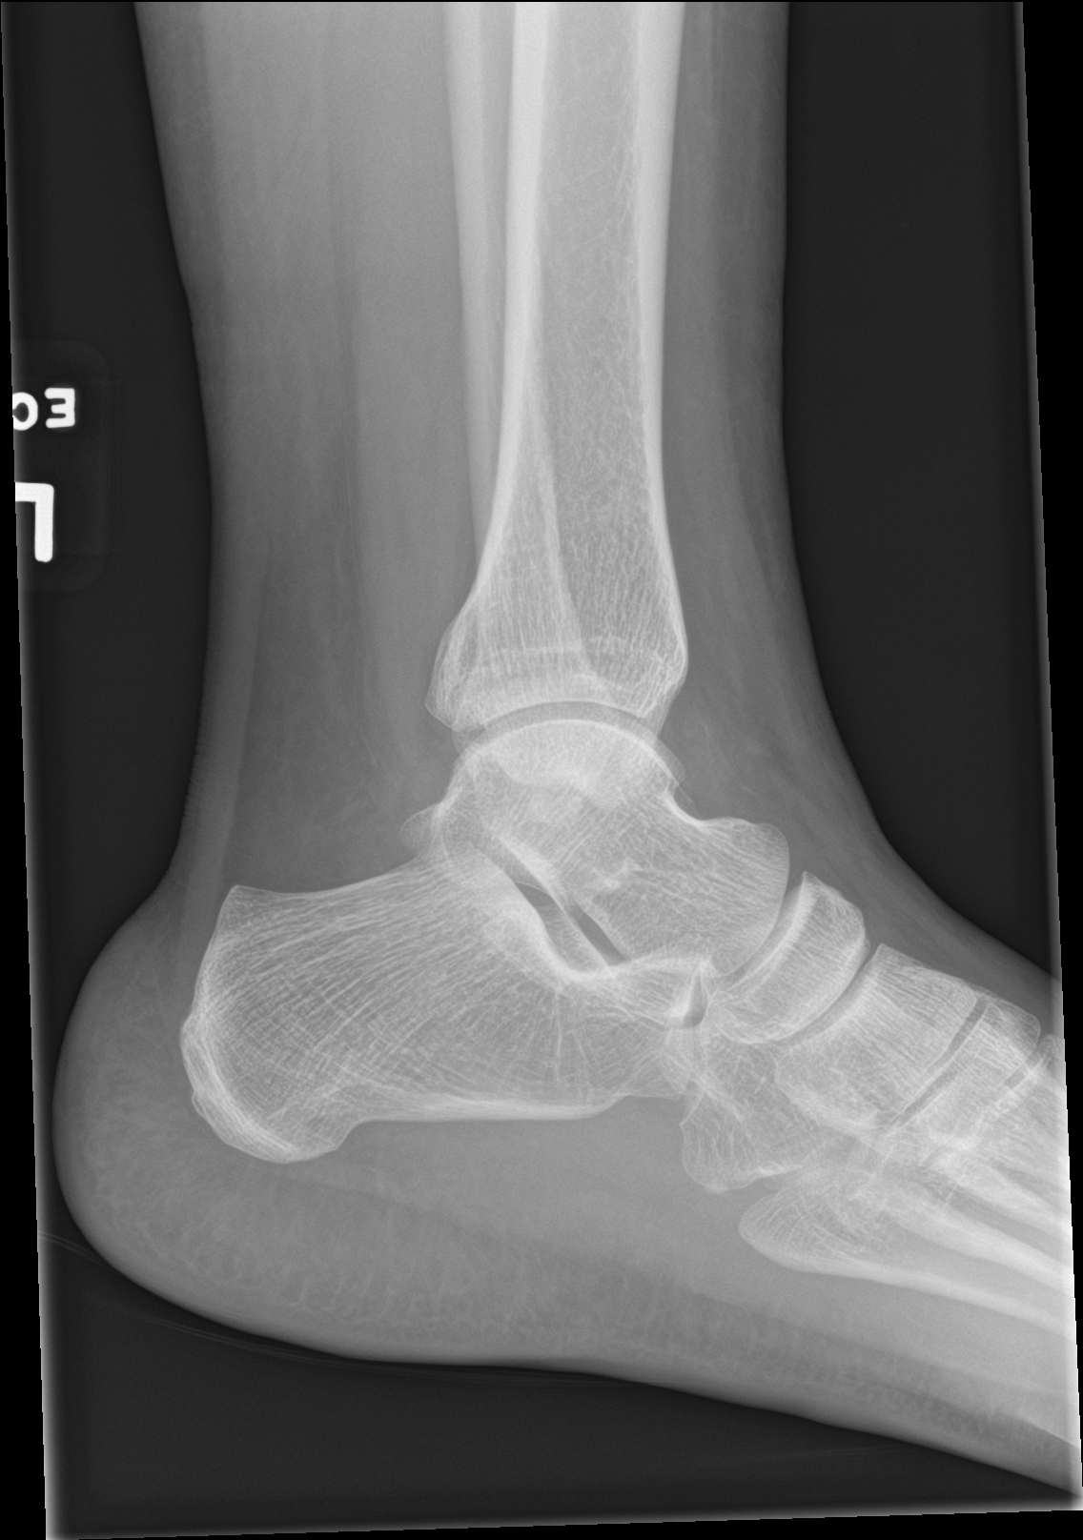

[3 of 3 positions shown; findings below may reference images not displayed]

FINDINGS: There is no evidence of fracture, dislocation, or joint effusion.
There is no evidence of arthropathy or other focal bone abnormality.
Soft tissues are unremarkable.
IMPRESSION: Negative.

## 2021-09-08 DIAGNOSIS — K029 Dental caries, unspecified: Secondary | ICD-10-CM | POA: Diagnosis not present

## 2021-10-27 DIAGNOSIS — K029 Dental caries, unspecified: Secondary | ICD-10-CM | POA: Diagnosis not present

## 2021-12-15 ENCOUNTER — Encounter (HOSPITAL_COMMUNITY): Payer: Self-pay | Admitting: Emergency Medicine

## 2021-12-15 ENCOUNTER — Ambulatory Visit (HOSPITAL_COMMUNITY)
Admission: EM | Admit: 2021-12-15 | Discharge: 2021-12-15 | Disposition: A | Discharge status: Home / Self Care | Payer: Medicaid Other | Attending: Internal Medicine | Admitting: Internal Medicine

## 2021-12-15 ENCOUNTER — Other Ambulatory Visit: Payer: Self-pay

## 2021-12-15 DIAGNOSIS — J069 Acute upper respiratory infection, unspecified: Secondary | ICD-10-CM | POA: Diagnosis not present

## 2021-12-15 DIAGNOSIS — Z1152 Encounter for screening for COVID-19: Secondary | ICD-10-CM | POA: Insufficient documentation

## 2021-12-15 LAB — RESP PANEL BY RT-PCR (FLU A&B, COVID) ARPGX2
Influenza A by PCR: POSITIVE — AB
Influenza B by PCR: NEGATIVE
SARS Coronavirus 2 by RT PCR: NEGATIVE

## 2021-12-15 MED ORDER — ACETAMINOPHEN 325 MG PO TABS
975.0000 mg | ORAL_TABLET | Freq: Once | ORAL | Status: AC
  Administered 2021-12-15: 975 mg via ORAL

## 2021-12-15 MED ORDER — KETOROLAC TROMETHAMINE 30 MG/ML IJ SOLN
30.0000 mg | Freq: Once | INTRAMUSCULAR | Status: AC
  Administered 2021-12-15: 30 mg via INTRAMUSCULAR

## 2021-12-15 MED ORDER — KETOROLAC TROMETHAMINE 30 MG/ML IJ SOLN
INTRAMUSCULAR | Status: AC
  Filled 2021-12-15: qty 1

## 2021-12-15 MED ORDER — ACETAMINOPHEN 325 MG PO TABS
ORAL_TABLET | ORAL | Status: AC
  Filled 2021-12-15: qty 3

## 2021-12-15 MED ORDER — BENZONATATE 100 MG PO CAPS: 100.0000 mg | ORAL_CAPSULE | Freq: Three times a day (TID) | ORAL | 0 refills | Status: DC

## 2021-12-15 MED ORDER — GUAIFENESIN ER 1200 MG PO TB12: 1200.0000 mg | ORAL_TABLET | Freq: Two times a day (BID) | ORAL | 0 refills | Status: DC

## 2021-12-15 NOTE — ED Provider Notes (Signed)
[facility]    CSN: [id number] Arrival date & time: 12/15/21  1120      History   Chief Complaint Chief Complaint  Patient presents with   URI    HPI Diana Flynn is a 41 y.o. female.   Patient presents to urgent care for evaluation of sore throat, nasal congestion, body aches, fever/chills, headache, cough, fatigue that started 2 days ago on Monday December 13, 2021. Cough is dry and worse at nighttime.  Headache is generalized.  Denies vision changes and dizziness.  Reports chills but unknown highest temp at home. Also reports slight nausea with standing and decreased appetite.  Denies shortness of breath, chest pain, nausea, vomiting, abdominal pain, diarrhea, and eye drainage.  No known sick contacts.  Denies history of asthma or chronic respiratory problems. Patient is not a smoker and denies drug use. Patient stopped smoking cigarettes 4 weeks ago. Denies other drug use. They are not vaccinated against COVID-19 and they have not received their seasonal flu vaccine this year.  Has attempted use of theraflu prior to arrival at urgent care for relief of symptoms without relief. Body aches are currently rated an 8 on a scale of 0-10. Last doses of medicines were last night to help with symptoms.    URI   Past Medical History:  Diagnosis Date   Hx of varicella    Infection    UTI   Normal pregnancy, first 01/25/2012   SVD (spontaneous vaginal delivery) 01/25/2012    Patient Active Problem List   Diagnosis Date Noted   Indication for care in labor and delivery, antepartum 03/07/2016   NSVD (normal spontaneous vaginal delivery) 03/07/2016    Past Surgical History:  Procedure Laterality Date   INNER EAR SURGERY      OB History     Gravida  5   Para  2   Term  2   Preterm  0   AB  2   Living  2      SAB  0   IAB  2   Ectopic  0   Multiple  0   Live Births  2        Obstetric Comments  TAB with medication           Home  Medications    Prior to Admission medications   Medication Sig Start Date End Date Taking? Authorizing Provider  benzonatate (TESSALON) 100 MG capsule Take 1 capsule (100 mg total) by mouth every 8 (eight) hours. 12/15/21  Yes Carlisle BeersStanhope, Mead Slane M, FNP  Guaifenesin 1200 MG TB12 Take 1 tablet (1,200 mg total) by mouth in the morning and at bedtime. 12/15/21  Yes Carlisle BeersStanhope, Shemekia Patane M, FNP  cyproheptadine (PERIACTIN) 4 MG tablet Take 1 tablet (4 mg total) by mouth 2 (two) times daily as needed. Patient not taking: Reported on 12/15/2021 10/17/19   Thressa ShellerHogan, Heather D, CNM  Prenatal Vit-Fe Fumarate-FA (PREPLUS) 27-1 MG TABS Take 1 tablet by mouth daily. Patient not taking: Reported on 12/15/2021 03/08/16   Bovard-Stuckert, Augusto GambleJody, MD  promethazine (PHENERGAN) 25 MG tablet Take 0.5-1 tablets (12.5-25 mg total) by mouth every 6 (six) hours as needed for nausea or vomiting. Patient not taking: Reported on 12/15/2021 10/17/19   Armando ReichertHogan, Heather D, CNM  traMADol (ULTRAM) 50 MG tablet Take 1 tablet (50 mg total) by mouth every 6 (six) hours as needed. Patient not taking: Reported on 12/15/2021 12/17/19   Lorre NickAllen, Anthony, MD    Family History Family History  Problem Relation Age of Onset   Diabetes Mother    Diabetes Maternal Grandmother    Hypertension Maternal Grandmother    Cancer Maternal Aunt     Social History Social History   Tobacco Use   Smoking status: Former    Types: Cigarettes   Smokeless tobacco: Never   Tobacco comments:    quit early preg  Vaping Use   Vaping Use: Some days  Substance Use Topics   Alcohol use: Yes    Comment: occ   Drug use: Yes    Types: Marijuana    Comment: not in over a month     Allergies   Patient has no known allergies.   Review of Systems Review of Systems Per HPI  Physical Exam Triage Vital Signs ED Triage Vitals  Enc Vitals Group     BP 12/15/21 1228 114/86     Pulse Rate 12/15/21 1228 (!) 103     Resp 12/15/21 1228 20     Temp  12/15/21 1228 98.1 F (36.7 C)     Temp Source 12/15/21 1228 Oral     SpO2 12/15/21 1228 96 %     Weight --      Height --      Head Circumference --      Peak Flow --      Pain Score 12/15/21 1225 8     Pain Loc --      Pain Edu? --      Excl. in GC? --    No data found.  Updated Vital Signs BP 114/86 (BP Location: Left Arm)   Pulse (!) 103   Temp 98.1 F (36.7 C) (Oral)   Resp 20   LMP 12/15/2021   SpO2 96%   Visual Acuity Right Eye Distance:   Left Eye Distance:   Bilateral Distance:    Right Eye Near:   Left Eye Near:    Bilateral Near:     Physical Exam Vitals and nursing note reviewed.  Constitutional:      Appearance: She is ill-appearing. She is not toxic-appearing.     Comments: Patient laying in position of comfort on exam table, appears uncomfortable and ill.   HENT:     Head: Normocephalic and atraumatic.     Right Ear: Hearing, tympanic membrane, ear canal and external ear normal.     Left Ear: Hearing, tympanic membrane, ear canal and external ear normal.     Nose: Congestion present.     Mouth/Throat:     Lips: Pink.     Mouth: Mucous membranes are moist.     Pharynx: Posterior oropharyngeal erythema present.     Comments: Small amount of clear postnasal drainage visualized to the posterior oropharynx.  Eyes:     General: Lids are normal. Vision grossly intact. Gaze aligned appropriately.     Extraocular Movements: Extraocular movements intact.     Conjunctiva/sclera: Conjunctivae normal.  Cardiovascular:     Rate and Rhythm: Normal rate and regular rhythm.     Heart sounds: Normal heart sounds, S1 normal and S2 normal.  Pulmonary:     Effort: Pulmonary effort is normal. No respiratory distress.     Breath sounds: Normal breath sounds and air entry.  Musculoskeletal:     Cervical back: Neck supple.  Lymphadenopathy:     Cervical: Cervical adenopathy present.  Skin:    General: Skin is warm and dry.     Capillary Refill: Capillary refill  takes less than 2  seconds.     Findings: No rash.  Neurological:     General: No focal deficit present.     Mental Status: She is alert and oriented to person, place, and time. Mental status is at baseline.     Cranial Nerves: No dysarthria or facial asymmetry.  Psychiatric:        Mood and Affect: Mood normal.        Speech: Speech normal.        Behavior: Behavior normal.        Thought Content: Thought content normal.        Judgment: Judgment normal.      UC Treatments / Results  Labs (all labs ordered are listed, but only abnormal results are displayed) Labs Reviewed  RESP PANEL BY RT-PCR (FLU A&B, COVID) [id number]    EKG   Radiology No results found.  Procedures Procedures (including critical care time)  Medications Ordered in UC Medications  ketorolac (TORADOL) 30 MG/ML injection 30 mg (has no administration in time range)  acetaminophen (TYLENOL) tablet 975 mg (has no administration in time range)    Initial Impression / Assessment and Plan / UC Course  I have reviewed the triage vital signs and the nursing notes.  Pertinent labs & imaging results that were available during my care of the patient were reviewed by me and considered in my medical decision making (see chart for details).   Viral URI with cough Symptoms and physical exam consistent with a viral upper respiratory tract infection that will likely resolve with rest, fluids, and prescriptions for symptomatic relief. No indication for imaging today based on stable cardiopulmonary exam and hemodynamically stable vital signs. COVID-19 and influenza testing is pending.  We will call patient if this is positive.  Quarantine guidelines discussed. Currently on day 3 of symptoms and does qualify for antiviral therapy.   Patient given ketorolac 30mg  IM and 975mg  tylenol in clinic today for body aches. Tessalon pearles and Guaifenesin sent to pharmacy for symptomatic relief to be taken as prescribed.  No NSAIDs  until tomorrow due to ketorolac injection.  May use ibuprofen/tylenol over the counter for body aches, fever/chills, and overall discomfort associated with viral illness. Nonpharmacologic interventions for symptom relief provided and after visit summary below.   Strict ED/urgent care return precautions given.  Patient verbalizes understanding and agreement with plan.  Counseled patient regarding possible side effects and uses of all medications prescribed at today's visit.  Patient verbalizes understanding and agreement with plan.  All questions answered.  Patient discharged from urgent care in stable condition.        Final Clinical Impressions(s) / UC Diagnoses   Final diagnoses:  Viral URI with cough     Discharge Instructions      You have a viral upper respiratory infection.  COVID-19 and influenzatesting is pending. We will call you with results if positive. If your COVID test is positive, you must stay at home until day 6 of symptoms. On day 6, you may go out into public and go back to work, but you must wear a mask until day 11 of symptoms to prevent spread to others.  Purchase mucinex (guaifenesin) 1200mg  and take this every 12 hours for the next few days to thin your nasal congestion and mucous so that you are able to get out of your body easier by coughing and blowing your nose. Drink plenty of water while taking this.  Take tessalon pearles every 8 hours as needed for  cough.  You may take tylenol 1,000mg  and ibuprofen 600mg  every 6 hours with food as needed for fever/chills, sore throat, aches/pains, and inflammation associated with viral illness. Take this with food to avoid stomach upset.   We gave you a shot of ketorolac in the clinic today, so don't take ibuprofen until tomorrow.  If you develop any new or worsening symptoms, please return.  If your symptoms are severe, please go to the emergency room.  Follow-up with your primary care provider for further evaluation and  management of your symptoms as well as ongoing wellness visits.  I hope you feel better!     ED Prescriptions     Medication Sig Dispense Auth. Provider   benzonatate (TESSALON) 100 MG capsule Take 1 capsule (100 mg total) by mouth every 8 (eight) hours. 21 capsule M, FNP   Guaifenesin 1200 MG TB12 Take 1 tablet (1,200 mg total) by mouth in the morning and at bedtime. 14 tablet M, FNP      PDMP not reviewed this encounter.   Carlisle Beers, Carlisle Beers 12/15/21 1343

## 2021-12-15 NOTE — Discharge Instructions (Addendum)
You have a viral upper respiratory infection.  COVID-19 and influenzatesting is pending. We will call you with results if positive. If your COVID test is positive, you must stay at home until day 6 of symptoms. On day 6, you may go out into public and go back to work, but you must wear a mask until day 11 of symptoms to prevent spread to others.  Purchase mucinex (guaifenesin) 1200mg  and take this every 12 hours for the next few days to thin your nasal congestion and mucous so that you are able to get out of your body easier by coughing and blowing your nose. Drink plenty of water while taking this.  Take tessalon pearles every 8 hours as needed for cough.  You may take tylenol 1,000mg  and ibuprofen 600mg  every 6 hours with food as needed for fever/chills, sore throat, aches/pains, and inflammation associated with viral illness. Take this with food to avoid stomach upset.   We gave you a shot of ketorolac in the clinic today, so don't take ibuprofen until tomorrow.  If you develop any new or worsening symptoms, please return.  If your symptoms are severe, please go to the emergency room.  Follow-up with your primary care provider for further evaluation and management of your symptoms as well as ongoing wellness visits.  I hope you feel better!

## 2021-12-15 NOTE — ED Triage Notes (Signed)
Cold chills, shivering, aching, coughing-started Monday night.  Has been taking theraflu.

## 2022-04-24 DIAGNOSIS — Z1231 Encounter for screening mammogram for malignant neoplasm of breast: Secondary | ICD-10-CM | POA: Diagnosis not present

## 2022-04-24 DIAGNOSIS — Z1389 Encounter for screening for other disorder: Secondary | ICD-10-CM | POA: Diagnosis not present

## 2022-04-24 DIAGNOSIS — Z1151 Encounter for screening for human papillomavirus (HPV): Secondary | ICD-10-CM | POA: Diagnosis not present

## 2022-04-24 DIAGNOSIS — Z01419 Encounter for gynecological examination (general) (routine) without abnormal findings: Secondary | ICD-10-CM | POA: Diagnosis not present

## 2022-04-24 DIAGNOSIS — Z130 Encounter for screening for diseases of the blood and blood-forming organs and certain disorders involving the immune mechanism: Secondary | ICD-10-CM | POA: Diagnosis not present

## 2022-04-24 DIAGNOSIS — Z124 Encounter for screening for malignant neoplasm of cervix: Secondary | ICD-10-CM | POA: Diagnosis not present

## 2022-04-24 DIAGNOSIS — I10 Essential (primary) hypertension: Secondary | ICD-10-CM | POA: Diagnosis not present

## 2022-04-24 DIAGNOSIS — Z01812 Encounter for preprocedural laboratory examination: Secondary | ICD-10-CM | POA: Diagnosis not present

## 2022-04-24 DIAGNOSIS — Z113 Encounter for screening for infections with a predominantly sexual mode of transmission: Secondary | ICD-10-CM | POA: Diagnosis not present

## 2022-05-11 ENCOUNTER — Encounter (HOSPITAL_COMMUNITY): Payer: Self-pay | Admitting: Obstetrics and Gynecology

## 2022-05-11 ENCOUNTER — Inpatient Hospital Stay (HOSPITAL_COMMUNITY)
Admission: AD | Admit: 2022-05-11 | Discharge: 2022-05-11 | Disposition: A | Discharge status: Home / Self Care | Payer: Medicaid Other | Attending: Obstetrics and Gynecology | Admitting: Obstetrics and Gynecology

## 2022-05-11 ENCOUNTER — Inpatient Hospital Stay (HOSPITAL_COMMUNITY): Payer: Medicaid Other

## 2022-05-11 DIAGNOSIS — Z3A01 Less than 8 weeks gestation of pregnancy: Secondary | ICD-10-CM | POA: Insufficient documentation

## 2022-05-11 DIAGNOSIS — O09521 Supervision of elderly multigravida, first trimester: Secondary | ICD-10-CM | POA: Diagnosis not present

## 2022-05-11 DIAGNOSIS — O209 Hemorrhage in early pregnancy, unspecified: Secondary | ICD-10-CM | POA: Diagnosis not present

## 2022-05-11 DIAGNOSIS — O3680X0 Pregnancy with inconclusive fetal viability, not applicable or unspecified: Secondary | ICD-10-CM | POA: Insufficient documentation

## 2022-05-11 DIAGNOSIS — O26891 Other specified pregnancy related conditions, first trimester: Secondary | ICD-10-CM | POA: Insufficient documentation

## 2022-05-11 HISTORY — DX: Essential (primary) hypertension: I10

## 2022-05-11 LAB — CBC
HCT: 38.8 % (ref 36.0–46.0)
Hemoglobin: 12.2 g/dL (ref 12.0–15.0)
MCH: 22.8 pg — ABNORMAL LOW (ref 26.0–34.0)
MCHC: 31.4 g/dL (ref 30.0–36.0)
MCV: 72.5 fL — ABNORMAL LOW (ref 80.0–100.0)
Platelets: 305 10*3/uL (ref 150–400)
RBC: 5.35 MIL/uL — ABNORMAL HIGH (ref 3.87–5.11)
RDW: 13.4 % (ref 11.5–15.5)
WBC: 4.4 10*3/uL (ref 4.0–10.5)
nRBC: 0 % (ref 0.0–0.2)

## 2022-05-11 LAB — HCG, QUANTITATIVE, PREGNANCY: hCG, Beta Chain, Quant, S: 224 m[IU]/mL — ABNORMAL HIGH (ref <5)

## 2022-05-11 LAB — POCT PREGNANCY, URINE: Preg Test, Ur: POSITIVE — AB

## 2022-05-11 LAB — ABO/RH: ABO/RH(D): O POS

## 2022-05-11 NOTE — MAU Provider Note (Signed)
History     [id number]  Arrival date and time: 05/11/22 0700    Chief Complaint  Patient presents with   Vaginal Bleeding   Abdominal Pain   Possible Pregnancy     HPI Diana Flynn is a 42 y.o. [id number] at [redacted]w[redacted]d by LMP who presents for vaginal bleeding & abdominal cramping. Symptoms started yesterday. Reports pink spotting that turned red this morning. Sees it on toilet paper; not bleeding into a pad. Reports mild bilateral abdominal cramping. Last intercourse was evening before spotting started. Denies dysuria, vaginal discharge, or vaginal irritation. Goes to [facility] - has ultrasound scheduled with them next week.    --/--/O POS (05/09 0739)  OB History     Gravida  5   Para  2   Term  2   Preterm  0   AB  2   Living  2      SAB  0   IAB  2   Ectopic  0   Multiple  0   Live Births  2        Obstetric Comments            Past Medical History:  Diagnosis Date   Hx of varicella    Hypertension     Past Surgical History:  Procedure Laterality Date   INNER EAR SURGERY      Family History  Problem Relation Age of Onset   Hypertension Mother    Diabetes Mother    Other Father        unknown   Cancer Maternal Aunt    Diabetes Maternal Grandmother    Hypertension Maternal Grandmother     Social History   Socioeconomic History   Marital status: Single    Spouse name: Not on file   Number of children: Not on file   Years of education: Not on file   Highest education level: Not on file  Occupational History   Not on file  Tobacco Use   Smoking status: Former    Types: Cigarettes   Smokeless tobacco: Never   Tobacco comments:    quit early preg  Vaping Use   Vaping Use: Former  Substance and Sexual Activity   Alcohol use: Not Currently   Drug use: Not Currently    Types: Marijuana    Comment: occ, not since found out   Sexual activity: Yes  Other Topics Concern   Not on file  Social History Narrative   Not on file    Social Determinants of Health   Financial Resource Strain: Not on file  Food Insecurity: Not on file  Transportation Needs: Not on file  Physical Activity: Not on file  Stress: Not on file  Social Connections: Not on file  Intimate Partner Violence: Not on file    No Known Allergies  No current facility-administered medications on file prior to encounter.   Current Outpatient Medications on File Prior to Encounter  Medication Sig Dispense Refill   amLODipine (NORVASC) 5 MG tablet Take 5 mg by mouth daily.     benzonatate (TESSALON) 100 MG capsule Take 1 capsule (100 mg total) by mouth every 8 (eight) hours. 21 capsule 0   cyproheptadine (PERIACTIN) 4 MG tablet Take 1 tablet (4 mg total) by mouth 2 (two) times daily as needed. (Patient not taking: Reported on 12/15/2021) 30 tablet 0   Guaifenesin 1200 MG TB12 Take 1 tablet (1,200 mg total) by mouth in the morning and at bedtime. 14 tablet  0   Prenatal Vit-Fe Fumarate-FA (PREPLUS) 27-1 MG TABS Take 1 tablet by mouth daily. (Patient not taking: Reported on 12/15/2021) 30 tablet 12   promethazine (PHENERGAN) 25 MG tablet Take 0.5-1 tablets (12.5-25 mg total) by mouth every 6 (six) hours as needed for nausea or vomiting. (Patient not taking: Reported on 12/15/2021) 30 tablet 2   traMADol (ULTRAM) 50 MG tablet Take 1 tablet (50 mg total) by mouth every 6 (six) hours as needed. (Patient not taking: Reported on 12/15/2021) 15 tablet 0     ROS Pertinent positives and negative per HPI, all others reviewed and negative  Physical Exam   BP (!) 130/91 (BP Location: Right Arm)   Pulse 82   Temp 98 F (36.7 C) (Oral)   Resp 16   Ht 5\' 6"  (1.676 m)   Wt 82.8 kg   LMP 03/22/2022   SpO2 100%   BMI 29.46 kg/m   Patient Vitals for the past 24 hrs:  BP Temp Temp src Pulse Resp SpO2 Height Weight  05/11/22 0947 (!) 130/91 -- -- 82 16 100 % -- --  05/11/22 0719 (!) 135/96 98 F (36.7 C) Oral 89 16 100 % 5\' 6"  (1.676 m) 82.8 kg     Physical Exam Vitals and nursing note reviewed.  Constitutional:      General: She is not in acute distress.    Appearance: She is well-developed. She is not ill-appearing.  HENT:     Head: Normocephalic and atraumatic.  Eyes:     General: No scleral icterus.       Right eye: No discharge.        Left eye: No discharge.     Conjunctiva/sclera: Conjunctivae normal.  Pulmonary:     Effort: Pulmonary effort is normal. No respiratory distress.  Neurological:     General: No focal deficit present.     Mental Status: She is alert.  Psychiatric:        Mood and Affect: Mood normal.        Behavior: Behavior normal.      Labs Results for orders placed or performed during the hospital encounter of 05/11/22 (from the past 24 hour(s))  Pregnancy, urine POC     Status: Abnormal   Collection Time: 05/11/22  7:24 AM  Result Value Ref Range   Preg Test, Ur POSITIVE (A) NEGATIVE  CBC     Status: Abnormal   Collection Time: 05/11/22  7:39 AM  Result Value Ref Range   WBC 4.4 4.0 - 10.5 K/uL   RBC 5.35 (H) 3.87 - 5.11 MIL/uL   Hemoglobin 12.2 12.0 - 15.0 g/dL   HCT 04.5 40.9 - 81.1 %   MCV 72.5 (L) 80.0 - 100.0 fL   MCH 22.8 (L) 26.0 - 34.0 pg   MCHC 31.4 30.0 - 36.0 g/dL   RDW 91.4 78.2 - 95.6 %   Platelets 305 150 - 400 K/uL   nRBC 0.0 0.0 - 0.2 %  ABO/Rh     Status: None   Collection Time: 05/11/22  7:39 AM  Result Value Ref Range   ABO/RH(D) O POS    No rh immune globuloin      NOT A RH IMMUNE GLOBULIN CANDIDATE, PT RH POSITIVE Performed at [facility], [address], [place], [place] [address]   hCG, quantitative, pregnancy     Status: Abnormal   Collection Time: 05/11/22  7:39 AM  Result Value Ref Range   hCG, Beta Chain, Quant,  S 224 (H) <5 mIU/mL    Imaging US OB LESS THAN 14 WEEKS WITH OB TRANSVAGINAL  Result Date: 05/11/2022 CLINICAL DATA:  Vaginal bleeding, pelvic cramping. EXAM: OBSTETRIC <14 WK [place] AND TRANSVAGINAL OB US TECHNIQUE: Both transabdominal  and transvaginal ultrasound examinations were performed for complete evaluation of the gestation as well as the maternal uterus, adnexal regions, and pelvic cul-de-sac. Transvaginal technique was performed to assess early pregnancy. COMPARISON:  None Available. FINDINGS: Intrauterine gestational sac: None Yolk sac:  Not Visualized. Embryo:  Not Visualized. Cardiac Activity: Not Visualized. Subchorionic hemorrhage:  None visualized. Maternal uterus/adnexae: Right ovary is unremarkable. Corpus luteum cyst is seen left ovary. Trace free fluid is noted which most likely is physiologic. IMPRESSION: No intrauterine gestational sac, yolk sac, fetal pole, or cardiac activity visualized. Differential considerations include intrauterine gestation too early to be sonographically visualized, spontaneous abortion, or ectopic pregnancy. Consider follow-up ultrasound in 14 days and serial quantitative beta HCG follow-up. Electronically Signed   By: Lupita Raider M.D.   On: 05/11/2022 09:12    MAU Course  Procedures Lab Orders         CBC         hCG, quantitative, pregnancy         Pregnancy, urine POC    No orders of the defined types were placed in this encounter.  Imaging Orders         US OB LESS THAN 14 WEEKS WITH OB TRANSVAGINAL     MDM moderate  Assessment and Plan   1. Pregnancy of unknown anatomic location   2. [redacted] weeks gestation of pregnancy    -Pink/red spotting since intercourse night before last. Minimal cramping. She is RH positive. Reports a positive home pregnancy test some time in April. Ultrasound today shows no IUP & HCG is only 224. Will bring back on Saturday for repeat HCG to assess pregnancy status. Reviewed bleeding & ectopic precautions.    Dispo: discharged to home in stable condition.   Discharge Instructions     Discharge patient   Complete by: As directed    Discharge disposition: 01-Home or Self Care   Discharge patient date: 05/11/2022       Judeth Horn,  NP 05/11/22 9:48 AM  Allergies as of 05/11/2022   No Known Allergies      Medication List     STOP taking these medications    benzonatate 100 MG capsule Commonly known as: TESSALON   cyproheptadine 4 MG tablet Commonly known as: PERIACTIN   Guaifenesin 1200 MG Tb12   promethazine 25 MG tablet Commonly known as: PHENERGAN   traMADol 50 MG tablet Commonly known as: ULTRAM       TAKE these medications    amLODipine 5 MG tablet Commonly known as: NORVASC Take 5 mg by mouth daily.   PrePLUS 27-1 MG Tabs Take 1 tablet by mouth daily.

## 2022-05-11 NOTE — MAU Note (Addendum)
Oral Robling is a 42 y.o. at Unknown here in MAU reporting: is preg. +HPT, has [place] scheduled for next week.  Started spotting yesterday, was light pink.  Today, the spotting is more red.  Having some mild cramping. LMP: 3/20 Onset of complaint: yesterday Pain score: mile Vitals:   05/11/22 0719  BP: (!) 135/96  Pulse: 89  Resp: 16  Temp: 98 F (36.7 C)  SpO2: 100%      Lab orders placed from triage:  UPT   Had not taken BP med yet today

## 2022-05-11 NOTE — Discharge Instructions (Signed)
Return to care  If you have heavier bleeding that soaks through more than 2 pads per hour for an hour or more If you bleed so much that you feel like you might pass out or you do pass out If you have significant abdominal pain that is not improved with Tylenol   

## 2022-05-13 ENCOUNTER — Inpatient Hospital Stay (HOSPITAL_COMMUNITY): Payer: Medicaid Other | Attending: Obstetrics and Gynecology

## 2022-05-15 DIAGNOSIS — O209 Hemorrhage in early pregnancy, unspecified: Secondary | ICD-10-CM | POA: Diagnosis not present

## 2022-05-15 DIAGNOSIS — O034 Incomplete spontaneous abortion without complication: Secondary | ICD-10-CM | POA: Diagnosis not present

## 2022-06-01 ENCOUNTER — Ambulatory Visit (INDEPENDENT_AMBULATORY_CARE_PROVIDER_SITE_OTHER): Payer: Medicaid Other

## 2022-06-01 ENCOUNTER — Ambulatory Visit (HOSPITAL_COMMUNITY)
Admission: EM | Admit: 2022-06-01 | Discharge: 2022-06-01 | Disposition: A | Discharge status: Home / Self Care | Payer: Medicaid Other | Attending: Physician Assistant | Admitting: Physician Assistant

## 2022-06-01 ENCOUNTER — Encounter (HOSPITAL_COMMUNITY): Payer: Self-pay | Admitting: Emergency Medicine

## 2022-06-01 DIAGNOSIS — S93401A Sprain of unspecified ligament of right ankle, initial encounter: Secondary | ICD-10-CM

## 2022-06-01 DIAGNOSIS — M79604 Pain in right leg: Secondary | ICD-10-CM | POA: Diagnosis not present

## 2022-06-01 DIAGNOSIS — M7989 Other specified soft tissue disorders: Secondary | ICD-10-CM | POA: Diagnosis not present

## 2022-06-01 DIAGNOSIS — M25571 Pain in right ankle and joints of right foot: Secondary | ICD-10-CM | POA: Diagnosis not present

## 2022-06-01 NOTE — ED Provider Notes (Signed)
[facility]    CSN: [id number] Arrival date & time: 06/01/22  1810      History   Chief Complaint Chief Complaint  Patient presents with   Fall   Ankle Pain   Leg Injury    HPI Diana Flynn is a 42 y.o. female.   Patient complains of pain in her right ankle patient reports [place] the [place] patient reports she had some swelling and pain after the injury.  Patient reports swelling has progressively gotten worse.  Patient reports she now has some redness going up her leg.  Patient denies any fever or chills.  Patient is not experiencing any shortness of breath.  The history is provided by the patient. No language interpreter was used.  Fall This is a new problem. The problem occurs constantly. The problem has not changed since onset.Nothing aggravates the symptoms. Nothing relieves the symptoms. She has tried nothing for the symptoms. The treatment provided no relief.  Ankle Pain   Past Medical History:  Diagnosis Date   Hx of varicella    Hypertension     Patient Active Problem List   Diagnosis Date Noted   Indication for care in labor and delivery, antepartum 03/07/2016   NSVD (normal spontaneous vaginal delivery) 03/07/2016    Past Surgical History:  Procedure Laterality Date   INNER EAR SURGERY      OB History     Gravida  5   Para  2   Term  2   Preterm  0   AB  2   Living  2      SAB  0   IAB  2   Ectopic  0   Multiple  0   Live Births  2        Obstetric Comments             Home Medications    Prior to Admission medications   Medication Sig Start Date End Date Taking? Authorizing Provider  amLODipine (NORVASC) 5 MG tablet Take 5 mg by mouth daily.    [provider]  Prenatal Vit-Fe Fumarate-FA (PREPLUS) 27-1 MG TABS Take 1 tablet by mouth daily. Patient not taking: Reported on 12/15/2021 03/08/16   Sherian Rein, MD    Family History Family History  Problem Relation  Age of Onset   Hypertension Mother    Diabetes Mother    Other Father        unknown   Cancer Maternal Aunt    Diabetes Maternal Grandmother    Hypertension Maternal Grandmother     Social History Social History   Tobacco Use   Smoking status: Former    Types: Cigarettes   Smokeless tobacco: Never   Tobacco comments:    quit early preg  Vaping Use   Vaping Use: Former  Substance Use Topics   Alcohol use: Not Currently   Drug use: Not Currently    Types: Marijuana    Comment: occ, not since found out     Allergies   Patient has no known allergies.   Review of Systems Review of Systems  Musculoskeletal:  Positive for myalgias.  All other systems reviewed and are negative.    Physical Exam Triage Vital Signs ED Triage Vitals  Enc Vitals Group     BP 06/01/22 1911 122/85     Pulse Rate 06/01/22 1911 93     Resp 06/01/22 1911 17     Temp 06/01/22 1911  97.6 F (36.4 C)     Temp Source 06/01/22 1911 Oral     SpO2 06/01/22 1911 99 %     Weight --      Height --      Head Circumference --      Peak Flow --      Pain Score 06/01/22 1909 8     Pain Loc --      Pain Edu? --      Excl. in GC? --    No data found.  Updated Vital Signs BP 122/85 (BP Location: Left Arm)   Pulse 93   Temp 97.6 F (36.4 C) (Oral)   Resp 17   LMP 03/22/2022   SpO2 99%   Breastfeeding No   Visual Acuity Right Eye Distance:   Left Eye Distance:   Bilateral Distance:    Right Eye Near:   Left Eye Near:    Bilateral Near:     Physical Exam Vitals and nursing note reviewed.  Constitutional:      Appearance: She is well-developed.  HENT:     Head: Normocephalic.  Pulmonary:     Effort: Pulmonary effort is normal.  Abdominal:     General: There is no distension.  Musculoskeletal:        General: Swelling and tenderness present.     Comments: Swollen bruised right ankle,  pain with movement, swelling up to calf,  nv and ns intact   Skin:    General: Skin is warm.   Neurological:     General: No focal deficit present.     Mental Status: She is alert and oriented to person, place, and time.      UC Treatments / Results  Labs (all labs ordered are listed, but only abnormal results are displayed) Labs Reviewed - No data to display  EKG   Radiology DG Tibia/Fibula Right  Result Date: 06/01/2022 CLINICAL DATA:  Fall, right leg pain EXAM: RIGHT TIBIA AND FIBULA - 2 VIEW COMPARISON:  None Available. FINDINGS: There is no evidence of fracture or other focal bone lesions. Bimalleolar soft tissue swelling partially visualized. IMPRESSION: 1. Bimalleolar soft tissue swelling. No fracture or dislocation. Electronically Signed   By: Helyn Numbers M.D.   On: 06/01/2022 19:45   DG Ankle Complete Right  Result Date: 06/01/2022 CLINICAL DATA:  Fall, right ankle pain EXAM: RIGHT ANKLE - COMPLETE 3+ VIEW COMPARISON:  None Available. FINDINGS: There is no evidence of fracture, dislocation, or joint effusion. There is no evidence of arthropathy or other focal bone abnormality. Extensive bimalleolar soft tissue swelling. Small tibiotalar ankle effusion. IMPRESSION: 1. Extensive bimalleolar soft tissue swelling. No fracture or dislocation. Electronically Signed   By: Helyn Numbers M.D.   On: 06/01/2022 19:44    Procedures Procedures (including critical care time)  Medications Ordered in UC Medications - No data to display  Initial Impression / Assessment and Plan / UC Course  I have reviewed the triage vital signs and the nursing notes.  Pertinent labs & imaging results that were available during my care of the patient were reviewed by me and considered in my medical decision making (see chart for details).     MDM:  Pt placed in a cam walker.  Pt advised to follow up at Vascular lab for ultrasound in am  Final Clinical Impressions(s) / UC Diagnoses   Final diagnoses:  Sprain of right ankle, unspecified ligament, initial encounter     Discharge  Instructions  Follow up at vascular lab tomorrow for ultrasound.     ED Prescriptions   None    PDMP not reviewed this encounter. An After Visit Summary was printed and given to the patient.    Elson Areas, [place] 06/01/22 2010

## 2022-06-01 NOTE — ED Triage Notes (Signed)
Pt fell Sunday evening when feel over her shoe. Pt has swelling RLE and right ankle. Reports been working everyday since and stands up a lot. Took ibuprofen the first 2 days.

## 2022-06-01 NOTE — Discharge Instructions (Addendum)
Follow up at vascular lab tomorrow for ultrasound.

## 2022-06-02 ENCOUNTER — Ambulatory Visit (HOSPITAL_COMMUNITY)
Admission: RE | Admit: 2022-06-02 | Discharge: 2022-06-02 | Disposition: A | Discharge status: Home / Self Care | Payer: Medicaid Other | Source: Ambulatory Visit | Attending: Physician Assistant | Admitting: Physician Assistant

## 2022-06-02 DIAGNOSIS — M7989 Other specified soft tissue disorders: Secondary | ICD-10-CM | POA: Insufficient documentation

## 2022-06-02 NOTE — Progress Notes (Signed)
Right lower extremity venous study completed.   Please see CV Procedures for preliminary results.  Alexzavier Girardin, RVT  11:32 AM 06/02/22

## 2022-07-14 DIAGNOSIS — Z3201 Encounter for pregnancy test, result positive: Secondary | ICD-10-CM | POA: Diagnosis not present

## 2022-07-18 DIAGNOSIS — Z3201 Encounter for pregnancy test, result positive: Secondary | ICD-10-CM | POA: Diagnosis not present

## 2022-07-18 DIAGNOSIS — Z3A01 Less than 8 weeks gestation of pregnancy: Secondary | ICD-10-CM | POA: Diagnosis not present

## 2022-07-18 DIAGNOSIS — O219 Vomiting of pregnancy, unspecified: Secondary | ICD-10-CM | POA: Diagnosis not present

## 2022-07-18 DIAGNOSIS — O30041 Twin pregnancy, dichorionic/diamniotic, first trimester: Secondary | ICD-10-CM | POA: Diagnosis not present

## 2022-07-18 DIAGNOSIS — O3680X1 Pregnancy with inconclusive fetal viability, fetus 1: Secondary | ICD-10-CM | POA: Diagnosis not present

## 2022-08-09 DIAGNOSIS — O09521 Supervision of elderly multigravida, first trimester: Secondary | ICD-10-CM | POA: Diagnosis not present

## 2022-08-09 DIAGNOSIS — Z369 Encounter for antenatal screening, unspecified: Secondary | ICD-10-CM | POA: Diagnosis not present

## 2022-08-09 DIAGNOSIS — O26891 Other specified pregnancy related conditions, first trimester: Secondary | ICD-10-CM | POA: Diagnosis not present

## 2022-08-09 DIAGNOSIS — Z368A Encounter for antenatal screening for other genetic defects: Secondary | ICD-10-CM | POA: Diagnosis not present

## 2022-08-09 DIAGNOSIS — Z3401 Encounter for supervision of normal first pregnancy, first trimester: Secondary | ICD-10-CM | POA: Diagnosis not present

## 2022-08-09 DIAGNOSIS — Z3A10 10 weeks gestation of pregnancy: Secondary | ICD-10-CM | POA: Diagnosis not present

## 2022-08-09 DIAGNOSIS — O30041 Twin pregnancy, dichorionic/diamniotic, first trimester: Secondary | ICD-10-CM | POA: Diagnosis not present

## 2022-08-09 DIAGNOSIS — O169 Unspecified maternal hypertension, unspecified trimester: Secondary | ICD-10-CM | POA: Diagnosis not present

## 2022-08-09 DIAGNOSIS — Z3A09 9 weeks gestation of pregnancy: Secondary | ICD-10-CM | POA: Diagnosis not present

## 2022-08-09 DIAGNOSIS — Z3A01 Less than 8 weeks gestation of pregnancy: Secondary | ICD-10-CM | POA: Diagnosis not present

## 2022-08-09 DIAGNOSIS — Z113 Encounter for screening for infections with a predominantly sexual mode of transmission: Secondary | ICD-10-CM | POA: Diagnosis not present

## 2022-08-09 LAB — HEPATITIS C ANTIBODY: HCV Ab: NEGATIVE

## 2022-08-09 LAB — OB RESULTS CONSOLE HIV ANTIBODY (ROUTINE TESTING): HIV: NONREACTIVE

## 2022-08-09 LAB — OB RESULTS CONSOLE GC/CHLAMYDIA
Chlamydia: NEGATIVE
Neisseria Gonorrhea: NEGATIVE

## 2022-08-09 LAB — OB RESULTS CONSOLE HEPATITIS B SURFACE ANTIGEN: Hepatitis B Surface Ag: NEGATIVE

## 2022-08-09 LAB — OB RESULTS CONSOLE RUBELLA ANTIBODY, IGM: Rubella: IMMUNE

## 2022-08-09 LAB — OB RESULTS CONSOLE RPR: RPR: NONREACTIVE

## 2022-08-09 LAB — OB RESULTS CONSOLE ANTIBODY SCREEN: Antibody Screen: NEGATIVE

## 2022-08-28 DIAGNOSIS — O09521 Supervision of elderly multigravida, first trimester: Secondary | ICD-10-CM | POA: Diagnosis not present

## 2022-09-11 ENCOUNTER — Other Ambulatory Visit: Payer: Self-pay | Admitting: Obstetrics and Gynecology

## 2022-09-11 DIAGNOSIS — O3513X9 Maternal care for (suspected) chromosomal abnormality in fetus, trisomy 21, other fetus: Secondary | ICD-10-CM

## 2022-09-12 ENCOUNTER — Encounter: Payer: Self-pay | Admitting: *Deleted

## 2022-09-12 DIAGNOSIS — O99210 Obesity complicating pregnancy, unspecified trimester: Secondary | ICD-10-CM | POA: Insufficient documentation

## 2022-09-12 DIAGNOSIS — O09529 Supervision of elderly multigravida, unspecified trimester: Secondary | ICD-10-CM | POA: Insufficient documentation

## 2022-09-12 DIAGNOSIS — O3680X0 Pregnancy with inconclusive fetal viability, not applicable or unspecified: Secondary | ICD-10-CM | POA: Insufficient documentation

## 2022-09-12 DIAGNOSIS — O10919 Unspecified pre-existing hypertension complicating pregnancy, unspecified trimester: Secondary | ICD-10-CM | POA: Insufficient documentation

## 2022-09-12 DIAGNOSIS — O30049 Twin pregnancy, dichorionic/diamniotic, unspecified trimester: Secondary | ICD-10-CM | POA: Insufficient documentation

## 2022-09-13 ENCOUNTER — Ambulatory Visit: Payer: Medicaid Other

## 2022-09-13 ENCOUNTER — Ambulatory Visit: Payer: Medicaid Other | Attending: Obstetrics and Gynecology

## 2022-09-13 ENCOUNTER — Other Ambulatory Visit: Payer: Medicaid Other

## 2022-09-14 ENCOUNTER — Other Ambulatory Visit: Payer: Self-pay

## 2022-10-18 DIAGNOSIS — Z363 Encounter for antenatal screening for malformations: Secondary | ICD-10-CM | POA: Diagnosis not present

## 2022-10-18 DIAGNOSIS — O30042 Twin pregnancy, dichorionic/diamniotic, second trimester: Secondary | ICD-10-CM | POA: Diagnosis not present

## 2022-10-18 DIAGNOSIS — Z3A19 19 weeks gestation of pregnancy: Secondary | ICD-10-CM | POA: Diagnosis not present

## 2022-12-15 DIAGNOSIS — Z3689 Encounter for other specified antenatal screening: Secondary | ICD-10-CM | POA: Diagnosis not present

## 2023-01-03 NOTE — L&D Delivery Note (Signed)
 DELIVERY NOTE  Pt complete and at +2 station with urge to push. Epidural controlling pain. Vtx/Vtx confirmed.  Baby A: Pt pushed and delivered a viable female infant in OA position. Anterior and posterior shoulders spontaneously delivered with next two pushes; body easily followed next. Unable to place infant on abdomen 2/2 very short cord. Suction of mouth and nose performed. Cord was then clamped and cut by MD. Cord blood obtained, 3VC. Baby had a vigorous spontaneous cry noted. Attempted to deliver placenta with very gentle traction however friable cord with subsequent avulsion, no bleeding noted Baby B: BSUS confirmed vertex. CE complete/-2, intact bag. Clear AROM at 1317. Pt pushed and delivered a viable female infant in OP position. Anterior and posterior shoulders spontaneously delivered with next two pushes; body easily followed next. Infant placed on mothers abdomen and bulb suction of mouth and nose performed. Cord was then clamped and cut by MD. Cord blood obtained, 3VC. Baby had a vigorous spontaneous cry noted. Placenta then delivered at 1328 intact. Placenta A was then easily felt at external os and delivered intact at 1329. Placenta B with Umbilical clamp. One-time manual sweep with no retained products. Given multip uterus, TXA hung with pitocin.   Fundal massage performed and pitocin per protocol. Fundus firm. The following lacerations were noted: periurethral abrasion on right, hemostatic with pressure. EBL 500cc. Mother and baby stable. Counts correct  NICU present for delivery. 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

## 2023-01-04 DIAGNOSIS — Z3A30 30 weeks gestation of pregnancy: Secondary | ICD-10-CM | POA: Diagnosis not present

## 2023-01-04 DIAGNOSIS — O30043 Twin pregnancy, dichorionic/diamniotic, third trimester: Secondary | ICD-10-CM | POA: Diagnosis not present

## 2023-01-24 DIAGNOSIS — O10013 Pre-existing essential hypertension complicating pregnancy, third trimester: Secondary | ICD-10-CM | POA: Diagnosis not present

## 2023-01-24 DIAGNOSIS — Z3A33 33 weeks gestation of pregnancy: Secondary | ICD-10-CM | POA: Diagnosis not present

## 2023-01-24 DIAGNOSIS — O30043 Twin pregnancy, dichorionic/diamniotic, third trimester: Secondary | ICD-10-CM | POA: Diagnosis not present

## 2023-02-12 DIAGNOSIS — Z3685 Encounter for antenatal screening for Streptococcus B: Secondary | ICD-10-CM | POA: Diagnosis not present

## 2023-02-12 DIAGNOSIS — Z3A36 36 weeks gestation of pregnancy: Secondary | ICD-10-CM | POA: Diagnosis not present

## 2023-02-12 DIAGNOSIS — O163 Unspecified maternal hypertension, third trimester: Secondary | ICD-10-CM | POA: Diagnosis not present

## 2023-02-12 DIAGNOSIS — O10012 Pre-existing essential hypertension complicating pregnancy, second trimester: Secondary | ICD-10-CM | POA: Diagnosis not present

## 2023-02-12 DIAGNOSIS — O30043 Twin pregnancy, dichorionic/diamniotic, third trimester: Secondary | ICD-10-CM | POA: Diagnosis not present

## 2023-02-12 LAB — OB RESULTS CONSOLE GBS: GBS: POSITIVE

## 2023-02-13 ENCOUNTER — Telehealth (HOSPITAL_COMMUNITY): Payer: Self-pay | Admitting: *Deleted

## 2023-02-13 ENCOUNTER — Encounter (HOSPITAL_COMMUNITY): Payer: Self-pay | Admitting: *Deleted

## 2023-02-13 NOTE — Telephone Encounter (Signed)
Preadmission screen

## 2023-02-18 ENCOUNTER — Other Ambulatory Visit: Payer: Self-pay | Admitting: Obstetrics and Gynecology

## 2023-02-18 DIAGNOSIS — O30043 Twin pregnancy, dichorionic/diamniotic, third trimester: Secondary | ICD-10-CM

## 2023-02-20 ENCOUNTER — Other Ambulatory Visit: Payer: Self-pay

## 2023-02-20 ENCOUNTER — Encounter (HOSPITAL_COMMUNITY): Payer: Self-pay | Admitting: Obstetrics and Gynecology

## 2023-02-20 ENCOUNTER — Inpatient Hospital Stay (HOSPITAL_COMMUNITY)
Admission: AD | Admit: 2023-02-20 | Discharge: 2023-02-23 | DRG: 806 | Disposition: A | Discharge status: Home / Self Care | Payer: Medicaid Other | Attending: Obstetrics and Gynecology | Admitting: Obstetrics and Gynecology

## 2023-02-20 ENCOUNTER — Inpatient Hospital Stay (HOSPITAL_COMMUNITY): Payer: Medicaid Other

## 2023-02-20 ENCOUNTER — Inpatient Hospital Stay (HOSPITAL_COMMUNITY): Payer: Medicaid Other | Admitting: Anesthesiology

## 2023-02-20 DIAGNOSIS — O30049 Twin pregnancy, dichorionic/diamniotic, unspecified trimester: Principal | ICD-10-CM

## 2023-02-20 DIAGNOSIS — K219 Gastro-esophageal reflux disease without esophagitis: Secondary | ICD-10-CM | POA: Diagnosis present

## 2023-02-20 DIAGNOSIS — O1092 Unspecified pre-existing hypertension complicating childbirth: Principal | ICD-10-CM | POA: Diagnosis present

## 2023-02-20 DIAGNOSIS — Z8249 Family history of ischemic heart disease and other diseases of the circulatory system: Secondary | ICD-10-CM

## 2023-02-20 DIAGNOSIS — O9081 Anemia of the puerperium: Secondary | ICD-10-CM | POA: Diagnosis not present

## 2023-02-20 DIAGNOSIS — O99824 Streptococcus B carrier state complicating childbirth: Secondary | ICD-10-CM | POA: Diagnosis not present

## 2023-02-20 DIAGNOSIS — O99284 Endocrine, nutritional and metabolic diseases complicating childbirth: Secondary | ICD-10-CM | POA: Diagnosis present

## 2023-02-20 DIAGNOSIS — O30043 Twin pregnancy, dichorionic/diamniotic, third trimester: Secondary | ICD-10-CM | POA: Diagnosis not present

## 2023-02-20 DIAGNOSIS — D62 Acute posthemorrhagic anemia: Secondary | ICD-10-CM | POA: Diagnosis not present

## 2023-02-20 DIAGNOSIS — O99214 Obesity complicating childbirth: Secondary | ICD-10-CM | POA: Diagnosis not present

## 2023-02-20 DIAGNOSIS — Z87891 Personal history of nicotine dependence: Secondary | ICD-10-CM

## 2023-02-20 DIAGNOSIS — O3513X1 Maternal care for (suspected) chromosomal abnormality in fetus, trisomy 21, fetus 1: Secondary | ICD-10-CM | POA: Diagnosis present

## 2023-02-20 DIAGNOSIS — Z833 Family history of diabetes mellitus: Secondary | ICD-10-CM

## 2023-02-20 DIAGNOSIS — Z3A37 37 weeks gestation of pregnancy: Secondary | ICD-10-CM | POA: Diagnosis not present

## 2023-02-20 DIAGNOSIS — E876 Hypokalemia: Secondary | ICD-10-CM | POA: Diagnosis present

## 2023-02-20 DIAGNOSIS — O9962 Diseases of the digestive system complicating childbirth: Secondary | ICD-10-CM | POA: Diagnosis not present

## 2023-02-20 DIAGNOSIS — Z3A38 38 weeks gestation of pregnancy: Secondary | ICD-10-CM | POA: Diagnosis not present

## 2023-02-20 LAB — CBC
HCT: 32.1 % — ABNORMAL LOW (ref 36.0–46.0)
HCT: 32.6 % — ABNORMAL LOW (ref 36.0–46.0)
Hemoglobin: 9.9 g/dL — ABNORMAL LOW (ref 12.0–15.0)
Hemoglobin: 9.8 g/dL — ABNORMAL LOW (ref 12.0–15.0)
MCH: 20.8 pg — ABNORMAL LOW (ref 26.0–34.0)
MCH: 20.4 pg — ABNORMAL LOW (ref 26.0–34.0)
MCHC: 30.8 g/dL (ref 30.0–36.0)
MCHC: 30.1 g/dL (ref 30.0–36.0)
MCV: 67.4 fL — ABNORMAL LOW (ref 80.0–100.0)
MCV: 67.9 fL — ABNORMAL LOW (ref 80.0–100.0)
Platelets: 227 10*3/uL (ref 150–400)
Platelets: 220 10*3/uL (ref 150–400)
RBC: 4.76 MIL/uL (ref 3.87–5.11)
RBC: 4.8 MIL/uL (ref 3.87–5.11)
RDW: 16.5 % — ABNORMAL HIGH (ref 11.5–15.5)
RDW: 16.4 % — ABNORMAL HIGH (ref 11.5–15.5)
WBC: 6 10*3/uL (ref 4.0–10.5)
WBC: 6.7 10*3/uL (ref 4.0–10.5)
nRBC: 0 % (ref 0.0–0.2)
nRBC: 0 % (ref 0.0–0.2)

## 2023-02-20 LAB — RPR: RPR Ser Ql: NONREACTIVE

## 2023-02-20 LAB — TYPE AND SCREEN
ABO/RH(D): O POS
Antibody Screen: NEGATIVE

## 2023-02-20 LAB — COMPREHENSIVE METABOLIC PANEL
ALT: 16 U/L (ref 0–44)
AST: 25 U/L (ref 15–41)
Albumin: 2.5 g/dL — ABNORMAL LOW (ref 3.5–5.0)
Alkaline Phosphatase: 154 U/L — ABNORMAL HIGH (ref 38–126)
Anion gap: 12 (ref 5–15)
BUN: <5 mg/dL — ABNORMAL LOW (ref 6–20)
CO2: 20 mmol/L — ABNORMAL LOW (ref 22–32)
Calcium: 8.9 mg/dL (ref 8.9–10.3)
Chloride: 108 mmol/L (ref 98–111)
Creatinine, Ser: 0.65 mg/dL (ref 0.44–1.00)
GFR, Estimated: >60 mL/min (ref >60)
Glucose, Bld: 69 mg/dL — ABNORMAL LOW (ref 70–99)
Potassium: 3.2 mmol/L — ABNORMAL LOW (ref 3.5–5.1)
Sodium: 140 mmol/L (ref 135–145)
Total Bilirubin: 0.6 mg/dL (ref 0.0–1.2)
Total Protein: 6.4 g/dL — ABNORMAL LOW (ref 6.5–8.1)

## 2023-02-20 LAB — PROTEIN / CREATININE RATIO, URINE
Creatinine, Urine: 32 mg/dL
Total Protein, Urine: <6 mg/dL

## 2023-02-20 MED ORDER — TETANUS-DIPHTH-ACELL PERTUSSIS 5-2.5-18.5 LF-MCG/0.5 IM SUSY: 0.5000 mL | PREFILLED_SYRINGE | Freq: Once | INTRAMUSCULAR | Status: DC

## 2023-02-20 MED ORDER — LIDOCAINE HCL (PF) 1 % IJ SOLN
INTRAMUSCULAR | Status: DC | PRN
  Administered 2023-02-20 (×2): 5 mL via EPIDURAL

## 2023-02-20 MED ORDER — FENTANYL-BUPIVACAINE-NACL 0.5-0.125-0.9 MG/250ML-% EP SOLN
12.0000 mL/h | EPIDURAL | Status: DC | PRN
  Administered 2023-02-20: 12 mL/h via EPIDURAL
  Filled 2023-02-20: qty 250

## 2023-02-20 MED ORDER — PENICILLIN G POT IN DEXTROSE 60000 UNIT/ML IV SOLN
3.0000 10*6.[IU] | INTRAVENOUS | Status: DC
  Filled 2023-02-20: qty 50

## 2023-02-20 MED ORDER — SOD CITRATE-CITRIC ACID 500-334 MG/5ML PO SOLN: 30.0000 mL | ORAL | Status: DC | PRN

## 2023-02-20 MED ORDER — PRENATAL MULTIVITAMIN CH
1.0000 | ORAL_TABLET | Freq: Every day | ORAL | Status: DC
  Administered 2023-02-21 – 2023-02-23 (×3): 1 via ORAL
  Filled 2023-02-20 (×3): qty 1

## 2023-02-20 MED ORDER — TERBUTALINE SULFATE 1 MG/ML IJ SOLN: 0.2500 mg | Freq: Once | INTRAMUSCULAR | Status: DC | PRN

## 2023-02-20 MED ORDER — SENNOSIDES-DOCUSATE SODIUM 8.6-50 MG PO TABS
2.0000 | ORAL_TABLET | Freq: Every day | ORAL | Status: DC
  Administered 2023-02-21 – 2023-02-23 (×3): 2 via ORAL
  Filled 2023-02-20 (×3): qty 2

## 2023-02-20 MED ORDER — EPHEDRINE 5 MG/ML INJ: 10.0000 mg | INTRAVENOUS | Status: DC | PRN

## 2023-02-20 MED ORDER — LIDOCAINE HCL (PF) 1 % IJ SOLN: 30.0000 mL | INTRAMUSCULAR | Status: DC | PRN

## 2023-02-20 MED ORDER — BENZOCAINE-MENTHOL 20-0.5 % EX AERO: 1.0000 | INHALATION_SPRAY | CUTANEOUS | Status: DC | PRN

## 2023-02-20 MED ORDER — OXYTOCIN-SODIUM CHLORIDE 30-0.9 UT/500ML-% IV SOLN: 2.5000 [IU]/h | INTRAVENOUS | Status: DC

## 2023-02-20 MED ORDER — DIBUCAINE (PERIANAL) 1 % EX OINT: 1.0000 | TOPICAL_OINTMENT | CUTANEOUS | Status: DC | PRN

## 2023-02-20 MED ORDER — AMLODIPINE BESYLATE 5 MG PO TABS: 5.0000 mg | ORAL_TABLET | Freq: Every day | ORAL | Status: DC

## 2023-02-20 MED ORDER — SIMETHICONE 80 MG PO CHEW: 80.0000 mg | CHEWABLE_TABLET | ORAL | Status: DC | PRN

## 2023-02-20 MED ORDER — ONDANSETRON HCL 4 MG/2ML IJ SOLN: 4.0000 mg | INTRAMUSCULAR | Status: DC | PRN

## 2023-02-20 MED ORDER — ACETAMINOPHEN 325 MG PO TABS
650.0000 mg | ORAL_TABLET | ORAL | Status: DC | PRN
  Administered 2023-02-20 – 2023-02-22 (×4): 650 mg via ORAL
  Filled 2023-02-20 (×4): qty 2

## 2023-02-20 MED ORDER — DIPHENHYDRAMINE HCL 25 MG PO CAPS: 25.0000 mg | ORAL_CAPSULE | Freq: Four times a day (QID) | ORAL | Status: DC | PRN

## 2023-02-20 MED ORDER — ONDANSETRON HCL 4 MG PO TABS: 4.0000 mg | ORAL_TABLET | ORAL | Status: DC | PRN

## 2023-02-20 MED ORDER — LACTATED RINGERS IV SOLN: INTRAVENOUS | Status: DC

## 2023-02-20 MED ORDER — PHENYLEPHRINE 80 MCG/ML (10ML) SYRINGE FOR IV PUSH (FOR BLOOD PRESSURE SUPPORT): 80.0000 ug | PREFILLED_SYRINGE | INTRAVENOUS | Status: DC | PRN

## 2023-02-20 MED ORDER — TRANEXAMIC ACID-NACL 1000-0.7 MG/100ML-% IV SOLN
INTRAVENOUS | Status: AC
  Administered 2023-02-20: 1000 mg via INTRAVENOUS
  Filled 2023-02-20: qty 100

## 2023-02-20 MED ORDER — ZOLPIDEM TARTRATE 5 MG PO TABS: 5.0000 mg | ORAL_TABLET | Freq: Every evening | ORAL | Status: DC | PRN

## 2023-02-20 MED ORDER — AMLODIPINE BESYLATE 5 MG PO TABS: 5.0000 mg | ORAL_TABLET | Freq: Once | ORAL | Status: DC

## 2023-02-20 MED ORDER — ONDANSETRON HCL 4 MG/2ML IJ SOLN: 4.0000 mg | Freq: Four times a day (QID) | INTRAMUSCULAR | Status: DC | PRN

## 2023-02-20 MED ORDER — WITCH HAZEL-GLYCERIN EX PADS: 1.0000 | MEDICATED_PAD | CUTANEOUS | Status: DC | PRN

## 2023-02-20 MED ORDER — LACTATED RINGERS IV SOLN: 500.0000 mL | Freq: Once | INTRAVENOUS | Status: DC

## 2023-02-20 MED ORDER — SODIUM CHLORIDE 0.9 % IV SOLN
5.0000 10*6.[IU] | Freq: Once | INTRAVENOUS | Status: AC
  Administered 2023-02-20: 5 10*6.[IU] via INTRAVENOUS
  Filled 2023-02-20: qty 5

## 2023-02-20 MED ORDER — IBUPROFEN 600 MG PO TABS
600.0000 mg | ORAL_TABLET | Freq: Four times a day (QID) | ORAL | Status: DC
  Administered 2023-02-20 – 2023-02-23 (×12): 600 mg via ORAL
  Filled 2023-02-20 (×12): qty 1

## 2023-02-20 MED ORDER — OXYCODONE-ACETAMINOPHEN 5-325 MG PO TABS: 2.0000 | ORAL_TABLET | ORAL | Status: DC | PRN

## 2023-02-20 MED ORDER — TRANEXAMIC ACID-NACL 1000-0.7 MG/100ML-% IV SOLN: 1000.0000 mg | INTRAVENOUS | Status: AC

## 2023-02-20 MED ORDER — OXYTOCIN-SODIUM CHLORIDE 30-0.9 UT/500ML-% IV SOLN
1.0000 m[IU]/min | INTRAVENOUS | Status: DC
  Administered 2023-02-20: 2 m[IU]/min via INTRAVENOUS
  Filled 2023-02-20: qty 500

## 2023-02-20 MED ORDER — COCONUT OIL OIL: 1.0000 | TOPICAL_OIL | Status: DC | PRN

## 2023-02-20 MED ORDER — LACTATED RINGERS IV SOLN: 500.0000 mL | INTRAVENOUS | Status: DC | PRN

## 2023-02-20 MED ORDER — OXYTOCIN BOLUS FROM INFUSION
333.0000 mL | Freq: Once | INTRAVENOUS | Status: AC
Start: 2023-02-20 — End: 2023-02-20
  Administered 2023-02-20: 333 mL via INTRAVENOUS

## 2023-02-20 MED ORDER — AMLODIPINE BESYLATE 5 MG PO TABS
5.0000 mg | ORAL_TABLET | Freq: Every day | ORAL | Status: DC
  Administered 2023-02-20: 5 mg via ORAL
  Filled 2023-02-20: qty 1

## 2023-02-20 MED ORDER — FENTANYL CITRATE (PF) 100 MCG/2ML IJ SOLN: 50.0000 ug | INTRAMUSCULAR | Status: DC | PRN

## 2023-02-20 MED ORDER — MISOPROSTOL 25 MCG QUARTER TABLET: 25.0000 ug | ORAL_TABLET | ORAL | Status: DC | PRN

## 2023-02-20 MED ORDER — DIPHENHYDRAMINE HCL 50 MG/ML IJ SOLN: 12.5000 mg | INTRAMUSCULAR | Status: DC | PRN

## 2023-02-20 MED ORDER — OXYCODONE-ACETAMINOPHEN 5-325 MG PO TABS: 1.0000 | ORAL_TABLET | ORAL | Status: DC | PRN

## 2023-02-20 MED ORDER — ACETAMINOPHEN 325 MG PO TABS: 650.0000 mg | ORAL_TABLET | ORAL | Status: DC | PRN

## 2023-02-20 NOTE — Anesthesia Preprocedure Evaluation (Addendum)
 Anesthesia Evaluation  Patient identified by MRN, date of birth, ID band Patient awake    Reviewed: Allergy & Precautions, Patient's Chart, lab work & pertinent test results  Airway Mallampati: II  TM Distance: >3 FB     Dental no notable dental hx.    Pulmonary former smoker   Pulmonary exam normal        Cardiovascular hypertension, Pt. on medications Normal cardiovascular exam Rhythm:Regular     Neuro/Psych negative neurological ROS  negative psych ROS   GI/Hepatic ,GERD  ,,  Endo/Other  Obesity  Renal/GU   negative genitourinary   Musculoskeletal   Abdominal  (+) + obese  Peds  Hematology   Anesthesia Other Findings   Reproductive/Obstetrics (+) Pregnancy AMA 37 1/7 weeks Twin gestation Vertex/Vertex cHTN                              Anesthesia Physical Anesthesia Plan  ASA: 3  Anesthesia Plan: Epidural   Post-op Pain Management: Minimal or no pain anticipated   Induction: Intravenous  PONV Risk Score and Plan:   Airway Management Planned: Natural Airway  Additional Equipment:   Intra-op Plan:   Post-operative Plan:   Informed Consent: I have reviewed the patients History and Physical, chart, labs and discussed the procedure including the risks, benefits and alternatives for the proposed anesthesia with the patient or authorized representative who has indicated his/her understanding and acceptance.       Plan Discussed with: Anesthesiologist  Anesthesia Plan Comments:          Anesthesia Quick Evaluation

## 2023-02-20 NOTE — Lactation Note (Signed)
 This note was copied from a baby's chart. Lactation Consultation Note  Patient Name: Diana Flynn [id number] Date: 02/20/2023 Age:44 hours Reason for consult: Initial assessment;Early term 37-38.6wks (Baby A in NICU).  Baby B: [name] briefly latched Baby B for 3 minutes, with pillow support using the cross cradle hold, infant became tired and stopped. Afterwards, [name] supplemented infant 15 mls of 22 kcal formula while [name] used the DEBP. LC fitted [name] with 21 mm breast flange and [name] plans to continue to use DEBP every 3 hours for 15 minutes on initial setting. [name] is following LPTI feeding guidelines for Baby B and has feeding card in infant's basinet. [name] knows to call for further latch assistance if needed. [name] does have past hx of BF experienced see maternal data below. LC discussed importance of maternal rest, hydration, balance meals and snacks. [name] was made aware of O/P services, breastfeeding support groups, community resources, and our phone # for post-discharge questions.    Current feeding plan for Baby B: 1- [name] will continue to follow LPTI feeding guidelines, Limit total feedings to 30 minutes or less ( breast and bottle), will continue to supplement infant Day 1 with (12-14 mls) of breast milk and 22 kcal formula. 2- [name] will continue to use DEBP every 3 hours for 15 minutes and offer infant any EBM first before formula. 3- [name] knows to call for further latch assistance if needed for Baby B Maternal Data Has patient been taught Hand Expression?: No Does the patient have breastfeeding experience prior to this delivery?: Yes How long did the patient breastfeed?: Per [name], she BF her previous two children for 6 weeks due returning to work  Feeding Mother's Current Feeding Choice: Breast Milk and Formula Nipple Type: Slow - flow  LATCH Score Latch: Grasps breast easily, tongue down, lips flanged, rhythmical sucking.  Audible Swallowing: A few with stimulation  Type of Nipple:  Everted at rest and after stimulation  Comfort (Breast/Nipple): Soft / non-tender  Hold (Positioning): Assistance needed to correctly position infant at breast and maintain latch.  LATCH Score: 8   Lactation Tools Discussed/Used Tools: Pump;Flanges Flange Size: 21 Breast pump type: Double-Electric Breast Pump Pump Education: Setup, frequency, and cleaning;Milk Storage Reason for Pumping: Infant ETI, multipes and Baby A is in NICU Pumping frequency: [name] will continue to use DEBP every 3 hours for 15 minutes on inital setting.  Interventions Interventions: Breast feeding basics reviewed;Assisted with latch;Skin to skin;Breast compression;Adjust position;Support pillows;Position options;CDC Guidelines for Breast Pump Cleaning;LC Services brochure;Guidelines for Milk Supply and Pumping Schedule Handout;LPT handout/interventions  Discharge Pump:  ([name] is ordering DEBP with her insurance)  Consult Status Consult Status: Follow-up Date: 02/21/23 Follow-up type: In-patient    Frederico Hamman 02/20/2023, 8:23 PM

## 2023-02-20 NOTE — Anesthesia Procedure Notes (Signed)
 Epidural Patient location during procedure: OB Start time: 02/20/2023 10:15 AM End time: 02/20/2023 10:24 AM  Staffing Anesthesiologist: Mal Amabile, MD Performed: anesthesiologist   Preanesthetic Checklist Completed: patient identified, IV checked, site marked, risks and benefits discussed, surgical consent, monitors and equipment checked, pre-op evaluation and timeout performed  Epidural Patient position: sitting Prep: DuraPrep and site prepped and draped Patient monitoring: continuous pulse ox and blood pressure Approach: midline Location: L3-L4 Injection technique: LOR air  Needle:  Needle type: Tuohy  Needle gauge: 17 G Needle length: 9 cm and 9 Needle insertion depth: 6 cm Catheter type: closed end flexible Catheter size: 19 Gauge Catheter at skin depth: 11 cm Test dose: negative and Other  Assessment Events: blood not aspirated, no cerebrospinal fluid, injection not painful, no injection resistance, no paresthesia and negative IV test  Additional Notes Patient identified. Risks and benefits discussed including failed block, incomplete  Pain control, post dural puncture headache, nerve damage, paralysis, blood pressure Changes, nausea, vomiting, reactions to medications-both toxic and allergic and post Partum back pain. All questions were answered. Patient expressed understanding and wished to proceed. Sterile technique was used throughout procedure. Epidural site was Dressed with sterile barrier dressing. No paresthesias, signs of intravascular injection Or signs of intrathecal spread were encountered.  Patient was more comfortable after the epidural was dosed. Please see RN's note for documentation of vital signs and FHR which are stable. Reason for block:procedure for pain

## 2023-02-20 NOTE — H&P (Signed)
 Diana Flynn is a 43 y.o. female presenting for scheduled IOL. Took amlodipine this AM, admits to being nervous about process. Denies PreE symptoms. +FM x2, denies VB, LOF, irr cramping only PNC c/b 1) CHTN on Rx - amlodipine 5mg  every day, continued from pre-pregnancy, baseline labs WNL.  2) Abnormal chromosomal screening with AMA patient - high risk T21 in baby A, ultrasound findings @ anatomy absent NB, short long bones - declined MFM referral 3) di-di-TIUP - spontaneous: (A: 1897g/4lb3oz/26.3%tile, MVP 6.05cm, vtx B: 2086g/4lb10oz/39.5%tile, 5.13cm, vtx - 9% discordance)  GBS pos  OB History     Gravida  5   Para  2   Term  2   Preterm  0   AB  2   Living  2      SAB  0   IAB  2   Ectopic  0   Multiple  0   Live Births  2        Obstetric Comments           Past Medical History:  Diagnosis Date   Hx of varicella    Hypertension    Indication for care in labor and delivery, antepartum 03/07/2016   NSVD (normal spontaneous vaginal delivery) 03/07/2016   Past Surgical History:  Procedure Laterality Date   INNER EAR SURGERY     Family History: family history includes Cancer in her maternal aunt; Diabetes in her maternal grandmother and mother; Hypertension in her maternal grandmother and mother; Other in her father. Social History:  reports that she has quit smoking. Her smoking use included cigarettes. She has never used smokeless tobacco. She reports that she does not currently use alcohol. She reports that she does not currently use drugs after having used the following drugs: Marijuana.     Maternal Diabetes: No Genetic Screening: Abnormal:  Results: Elevated risk of Trisomy 21 Maternal Ultrasounds/Referrals: Absent nasal bone Fetal Ultrasounds or other Referrals:  None Maternal Substance Abuse:  No Significant Maternal Medications:  None Significant Maternal Lab Results:  Group B Strep positive Number of Prenatal Visits:greater than 3 verified  prenatal visits Maternal Vaccinations:TDap Other Comments:  None  Review of Systems  Constitutional:  Negative for chills and fever.  Respiratory:  Negative for shortness of breath.   Cardiovascular:  Negative for chest pain, palpitations and leg swelling.  Gastrointestinal:  Negative for abdominal pain and vomiting.  Neurological:  Negative for dizziness, weakness and headaches.  Psychiatric/Behavioral:  Negative for suicidal ideas.    Maternal Medical History:  Contractions: Frequency: irregular.   Fetal activity: Perceived fetal activity is normal.   Prenatal complications: PIH and IUGR.   Prenatal Complications - Diabetes: none.   Dilation: 4 Effacement (%): 70 Station: -3 Exam by:: C. Derrill Kay, RNC Blood pressure (!) 161/99, pulse 83, temperature 97.8 F (36.6 C), temperature source Oral, resp. rate 18, height 5\' 5"  (1.651 m), weight 97.8 kg, last menstrual period 03/22/2022, SpO2 100%. Exam Physical Exam Constitutional:      General: She is not in acute distress.    Appearance: She is well-developed.  HENT:     Head: Normocephalic and atraumatic.  Eyes:     Pupils: Pupils are equal, round, and reactive to light.  Cardiovascular:     Rate and Rhythm: Normal rate and regular rhythm.     Heart sounds: No murmur heard.    No gallop.  Abdominal:     Tenderness: There is no abdominal tenderness. There is no guarding or rebound.  Genitourinary:  Vagina: Normal.  Musculoskeletal:        General: Normal range of motion.     Cervical back: Normal range of motion and neck supple.  Skin:    General: Skin is warm and dry.  Neurological:     Mental Status: She is alert and oriented to person, place, and time.     Prenatal labs: ABO, Rh: --/--/O POS (02/18 1610) Antibody: NEG (02/18 9604) Rubella: Immune (08/07 0000) RPR: Nonreactive (08/07 0000)  HBsAg: Negative (08/07 0000)  HIV: Non-reactive (08/07 0000)  GBS: Positive/-- (02/10 0000)   Cat 1 tracing x2, irr  TOCO  Assessment/Plan: This is a 43yo [id number] @ 37 1/7 by LMP c/w TVUS presenting for IOL for CHTN on Rx in setting of di-di-TIUP with high risk of Trisomy 21 in baby AS and supporting ultrasound findings. GBS pos. CE 4cm on admission, pitocin and PCN started per protocol. Epidural prior to AROM. Vtx/vtx confirmed. Aware of possibility for csx for baby B, amenable. Pelvis proven to oz. Anticipate SVD. NICU to be present for delivery. Elevated BP on admission with normal PreE labs, close eye on status, may be 2/2 anxiety, will trend throughout labor course, continue home amlodipine 5mg .    Valerie Roys Teshia Mahone 02/20/2023, 8:34 AM

## 2023-02-20 NOTE — Progress Notes (Addendum)
 Clarification of RN note - when earlier elevated Bps were reported, second reading was heard as 151/100 not 161/100. Chart review and RN progress note confirms latter. Spoke with RN who is going in now to obtain vitals, will update here. Patient continues to be asymptomatic from PreE standpoint.   ADDENDUM 11:33 BP (!) 152/94 (BP Location: Right Arm)   Pulse 99   Temp 98 F (36.7 C) (Oral)   Resp 18   Ht 5\' 5"  (1.651 m)   Wt 97.8 kg   LMP 03/22/2022   SpO2 100%   Breastfeeding Unknown   BMI 35.88 kg/m  Continue to monitor. Will ensure SW visit tomorrow given child in NICU. Would advise for discharge PPD2 in order to observe proper HTN control prior to discharge as well as maximize time with both babies.

## 2023-02-20 NOTE — Progress Notes (Addendum)
 Called by RN for review of BP since delivery.  Vitals:   02/20/23 1446 02/20/23 1501 02/20/23 1528 02/20/23 1633  BP: (!) 147/101 (!) 146/99 (!) 156/101 (!) 156/101  Pulse: 87 84 84 86  Resp: 16  17 17   Temp:   97.7 F (36.5 C) 97.6 F (36.4 C)  TempSrc:   Oral Oral  SpO2:    100%  Weight:      Height:       Patient changing baby Sebastian's diaper, taking formula well. Baby girl [name] is upstairs in NICU currently on high flow O2, AV canal confirmed. NICU attending in room speaking with patient and family regarding surgical outcomes and timing as well as speech/pathology help for help with swallow/feeding.   Reviewed BP history with patient. Started on amlodipine 5mg  in 2023. Good control when taken daily. However, patient admits to forgetting meds for about five days then only taking yesterday and this AM. Denies HA, SOB, CP, RUQ pain, blurry vision. AAOx3, CV RRR/CTAB, trace pedal edema BL, fundus firm at umbilicus. Stressed importance of daily med use as it has been proven to control her pressures. Strong Fhx of HTN. Stressed importance of daily med use. Patient admits that she "just sometimes forgets to" but understands the importance of BP control in terms of PreE, stroke/CVD risks. Ordered as evening dose, continue to monitor BP. Patient agreeable, RN informed.

## 2023-02-20 NOTE — Progress Notes (Signed)
 Labor Note  S: comfortable s/p epidural. Denies VB, LOF, no longer with painful cramping. Denies PreE symptoms  O: BP (!) 144/97   Pulse 96   Temp 98.8 F (37.1 C) (Oral)   Resp 14   Ht 5\' 5"  (1.651 m)   Wt 97.8 kg   LMP 03/22/2022   SpO2 100%   BMI 35.88 kg/m  CE: 4/80/-3, vertex palpated FHR:  A: Baseline 130, +accels, -decels, min to mod variability B: Baseline 145, +accels, -decels, min to mod variability  TOCO q2m, pitocin at 6mU/min  Clear AROm baby A @ 1101  A/P: This is a 6 y.o. [id number] at [redacted]w[redacted]d  admitted for IOL for CHTn on Rx in setting of di-di-TIUP. GBS pos FWB: cat 1 x2, vtx/vtx MWB: s/p epidural, comfortable.  Labor course: s/p AROM, latent labor, continue pitocin per protocol CHTN: labs WNL, asymptomatic, elevated BP during admission, continue to monitor  Anticipate SVD

## 2023-02-20 NOTE — Progress Notes (Signed)
 Talked to Dr Reina Fuse re: elevated BP readings.  Was told by previous nurse in report that mom not consistently taking BP meds.  BP reading elevated twice in a row (2nd reading post NICU visit).  Was just 1st 4 hr check so wanted to confirm parameters for overnight BP readings.  Per Shivaji she expects elevated BP readings b/c patient was off BP medication.  As long as no new symptoms of preE (headache, blinding lights/vision changes, new edema in face/hands) Shivaji is ok with BP readings hovering around 140s/160s over 90-100s.  She thinks the increased BP readings are due to chronic hypertension with no meds instead of preeclampsia.

## 2023-02-20 NOTE — Progress Notes (Signed)
 Feeling intermittent pressure. CE ant lip/-1. Tracing Cat 1-2, occ variable but mod var. Baby B cat 1, TOCO q41m. BSUS confirms vtx/vtx. Quickly changed to complete, set up to push at this time, will notify NICU

## 2023-02-21 LAB — CBC
HCT: 28.4 % — ABNORMAL LOW (ref 36.0–46.0)
Hemoglobin: 8.7 g/dL — ABNORMAL LOW (ref 12.0–15.0)
MCH: 20.8 pg — ABNORMAL LOW (ref 26.0–34.0)
MCHC: 30.6 g/dL (ref 30.0–36.0)
MCV: 67.8 fL — ABNORMAL LOW (ref 80.0–100.0)
Platelets: 202 10*3/uL (ref 150–400)
RBC: 4.19 MIL/uL (ref 3.87–5.11)
RDW: 16.5 % — ABNORMAL HIGH (ref 11.5–15.5)
WBC: 7.6 10*3/uL (ref 4.0–10.5)
nRBC: 0 % (ref 0.0–0.2)

## 2023-02-21 LAB — COMPREHENSIVE METABOLIC PANEL
ALT: 15 U/L (ref 0–44)
AST: 26 U/L (ref 15–41)
Albumin: 2.2 g/dL — ABNORMAL LOW (ref 3.5–5.0)
Alkaline Phosphatase: 123 U/L (ref 38–126)
Anion gap: 9 (ref 5–15)
BUN: <5 mg/dL — ABNORMAL LOW (ref 6–20)
CO2: 22 mmol/L (ref 22–32)
Calcium: 8.2 mg/dL — ABNORMAL LOW (ref 8.9–10.3)
Chloride: 106 mmol/L (ref 98–111)
Creatinine, Ser: 0.62 mg/dL (ref 0.44–1.00)
GFR, Estimated: >60 mL/min (ref >60)
Glucose, Bld: 87 mg/dL (ref 70–99)
Potassium: 3.1 mmol/L — ABNORMAL LOW (ref 3.5–5.1)
Sodium: 137 mmol/L (ref 135–145)
Total Bilirubin: 0.6 mg/dL (ref 0.0–1.2)
Total Protein: 5.5 g/dL — ABNORMAL LOW (ref 6.5–8.1)

## 2023-02-21 MED ORDER — AMLODIPINE BESYLATE 5 MG PO TABS
10.0000 mg | ORAL_TABLET | Freq: Every day | ORAL | Status: DC
  Administered 2023-02-21 – 2023-02-23 (×3): 10 mg via ORAL
  Filled 2023-02-21 (×3): qty 2

## 2023-02-21 MED ORDER — ALBUTEROL SULFATE (2.5 MG/3ML) 0.083% IN NEBU: 2.5000 mg | INHALATION_SOLUTION | Freq: Once | RESPIRATORY_TRACT | Status: DC | PRN

## 2023-02-21 MED ORDER — EPINEPHRINE 0.3 MG/0.3ML IJ SOAJ: 0.3000 mg | Freq: Once | INTRAMUSCULAR | Status: DC | PRN

## 2023-02-21 MED ORDER — SODIUM CHLORIDE 0.9 % IV SOLN
500.0000 mg | Freq: Once | INTRAVENOUS | Status: AC
  Administered 2023-02-21: 500 mg via INTRAVENOUS
  Filled 2023-02-21: qty 25

## 2023-02-21 MED ORDER — METHYLPREDNISOLONE SODIUM SUCC 125 MG IJ SOLR: 125.0000 mg | Freq: Once | INTRAMUSCULAR | Status: DC | PRN

## 2023-02-21 MED ORDER — LABETALOL HCL 200 MG PO TABS
200.0000 mg | ORAL_TABLET | Freq: Two times a day (BID) | ORAL | Status: DC
  Administered 2023-02-21: 200 mg via ORAL
  Filled 2023-02-21: qty 1

## 2023-02-21 MED ORDER — SODIUM CHLORIDE 0.9 % IV SOLN: INTRAVENOUS | Status: AC | PRN

## 2023-02-21 MED ORDER — SODIUM CHLORIDE 0.9 % IV BOLUS: 500.0000 mL | Freq: Once | INTRAVENOUS | Status: DC | PRN

## 2023-02-21 MED ORDER — OXYCODONE HCL 5 MG PO TABS
5.0000 mg | ORAL_TABLET | Freq: Four times a day (QID) | ORAL | Status: DC | PRN
  Administered 2023-02-21: 5 mg via ORAL
  Filled 2023-02-21: qty 1

## 2023-02-21 MED ORDER — DIPHENHYDRAMINE HCL 50 MG/ML IJ SOLN: 25.0000 mg | Freq: Once | INTRAMUSCULAR | Status: DC | PRN

## 2023-02-21 MED ORDER — FERROUS SULFATE 325 (65 FE) MG PO TABS
325.0000 mg | ORAL_TABLET | Freq: Every day | ORAL | Status: DC
  Administered 2023-02-22 – 2023-02-23 (×2): 325 mg via ORAL
  Filled 2023-02-21 (×3): qty 1

## 2023-02-21 MED ORDER — POTASSIUM CHLORIDE CRYS ER 20 MEQ PO TBCR
20.0000 meq | EXTENDED_RELEASE_TABLET | Freq: Once | ORAL | Status: AC
  Administered 2023-02-21: 20 meq via ORAL
  Filled 2023-02-21: qty 1

## 2023-02-21 NOTE — Progress Notes (Signed)
 Maternal Pt emotional about [name]'s name on birth certificate. This Rn left a message with Birth Registry to see pt in the morning. Maternal Pt also emotional about her other baby in NICU and not being able to visit her. This RN assured Mom that her baby boy could go to [facility] while she visited her baby girl in NICU.

## 2023-02-21 NOTE — Progress Notes (Signed)
 Maternal pt with BP of 157/108 and no other s/s of preeclampsia. MD wrote order for Labetolol 200mg  BID. First dose given at 1718.

## 2023-02-21 NOTE — Lactation Note (Signed)
 This note was copied from a baby's chart. Lactation Consultation Note  Patient Name: Diana Flynn [id number] Date: 02/21/2023 Age:43 years Reason for consult: Follow-up assessment;Flynn term 37-38.6wks;Infant < 6lbs;Other (Comment) (SGA, cHTN, AMA)  Visited with family of 62 24/44 weeks old NICU female; Diana Flynn is a P3 and reported she's been pumping consistently yesterday and this morning but slowed down on her pumping this afternoon. She was overwhelmed and got emotional because she wasn't able to visit baby "Diana Flynn" in the NICU. MBU RN Corrie Dandy offered to take the other twin to the nursery so she can visit baby "Diana Flynn" in the NICU; provided emotional support. She's not eligible for a Stork pump but voiced that her insurance will cover one, she just needs to call them. She has been working on bottle feedings with baby "Diana Flynn" and has been supplementing every 3 hours according to LPI policy, praised her for her efforts.   Feeding Mother's Current Feeding Choice: Breast Milk and Formula Nipple Type: Slow - flow  Lactation Tools Discussed/Used Tools: Pump;Flanges Flange Size: 21 Breast pump type: Double-Electric Breast Pump Pump Education: Setup, frequency, and cleaning;Milk Storage Reason for Pumping: ETI twins, baby "A" in NICU Pumping frequency: 4-6 times/24 hours; every 3 hours recommended Pumped volume:  (drops)  Interventions Interventions: Breast feeding basics reviewed;DEBP;Education;LPT handout/interventions  Plan Pump both breasts on initiation mode every 3 hours for 15 minutes at a time; ideally 8 pumping sessions/24 hours STS whenever able to She'll try taking baby "Diana Flynn" to breast on feeding cues +8 times/24 hours Family will continue supplementing with Similac 22 calorie formula according to LPI policy: 18-21 on day 2 and 24-28 ml on day 3. Supplementation might be done prior latching due to maternal supply and baby's birth weight    No other support person present at  this time. All questions and concerns answered, family to contact [facility] services PRN.  Discharge Pump: Advised to call insurance company [facility] Program: Yes Referral sent to [facility] Status Consult Status: Follow-up Date: 02/22/23 Follow-up type: In-patient   Diana Flynn Diana Flynn 02/21/2023, 5:43 PM

## 2023-02-21 NOTE — Progress Notes (Signed)
 Today's Vitals   02/21/23 1107 02/21/23 1436 02/21/23 1553 02/21/23 1715  BP: (!) 140/93  (!) 157/108 (!) 157/108  Pulse: 96  98 98  Resp: 18     Temp:      TempSrc: Oral     SpO2: 100%     Weight:      Height:      PainSc: 6  6      Body mass index is 35.88 kg/m.  Already on amlodipine 10mg . Will add labetalol 200mg  BID and titrate up as needed.    Alinda Deem MD 02/21/23 5:19 PM

## 2023-02-21 NOTE — Clinical Social Work Maternal (Addendum)
 CLINICAL SOCIAL WORK MATERNAL/CHILD NOTE  Patient Details  Name: Diana Flynn MRN: [id number] Date of Birth: 01/16/1980  Date:  02/21/2023  Clinical Social Worker Initiating Note:  Vivi [place], [place] Date/Time: Initiated:  02/21/23/1145     Child's Name:  Pecola Leisure A: Huston Foley  Baby B: Jackquline Denmark    Biological Parents:  Mother ([name]: Diana Flynn 02/16/2023)   Need for Interpreter:  None   Reason for Referral:  Other (Comment) (NICU admission)   Address:  [address] [place] [address]    Phone number:  [phone] (home)     Additional phone number:   Household Members/Support Persons (HM/SP):   Household Member/Support Person 1, Household Member/Support Person 2   HM/SP Name Relationship DOB or Age  HM/SP -1 Nicki Furlan Daughter 01/25/2012  HM/SP -2 Everlean Alstrom Daughter 03/07/2016  HM/SP -3        HM/SP -4        HM/SP -5        HM/SP -6        HM/SP -7        HM/SP -8          Natural Supports (not living in the home):  Immediate Family, Extended Family   Professional Supports: None   Employment: Full-time   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   Homebound arranged:    Surveyor, quantity Resources:  Medicaid   Other Resources:  Sales executive  , [facility] [place]   Cultural/Religious Considerations Which May Impact Care:    Strengths:  Ability to meet basic needs  , Home prepared for child     Psychotropic Medications:         Pediatrician:       Pediatrician List:   [facility] [place]    [place]    [facility]      Pediatrician Fax Number:    Risk Factors/Current Problems:  None   Cognitive State:  Able to Concentrate  , Goal Oriented  , Alert     Mood/Affect:  Calm  , Tearful     CSW Assessment: CSW received consult for NICU admission.  CSW met with [name] to offer support and complete assessment.    CSW met with [name] at bedside and  introduced NICU CSW role. Throughout the assessment [name] presented with a full range of emotions and engaged with CSW. Upon entering the room, CSW observed [name] in bed with two visitors present at bedside. [name] welcomed CSW visit. CSW congratulated the family on baby boy, [place] and baby girl [name]. Before CSW began the assessment one of the visitors left the room. CSW offered [name] privacy, and she introduced the remaining visitor as [name]. [name] gave CSW permission to share information with [name] present. CSW asked to include [name] name as a biological parent in the assessment and [name] declined to include [name]'s name to the assessment. CSW asked [name] if the demographic information on hospital file was correct. [name] confirmed that the demographic information was correct. CSW asked about [name] household. [name] reported that it was just her and her two daughters in the household (see chart above). [name] reported that she works as an Secondary school teacher at [employer], and she receives [employer]. [name] reported that she would call [facility] and add the infants to her benefits.   CSW asked [name] how she had been doing.  [name] reported that she was doing "better." [name] reported that she was waiting to hear updates about Sages care in the NICU and how long the infant would have to stay in the NICU. [name] tearfully expressed feeling sad about having to leave her daughter in the NICU when she discharges home with her son. [name] reported that she expected the tears to eventually flow. CSW encouraged [name] to express her feelings as it was a safe space to do so. [name] shared that she supported her sister when her sister's child with special needs had to stay in the NICU for five months. CSW provided active listening and emotional support. CSW asked about [name] supports. [name] identfied [name], mom,sister, brother and brother's fiance' as her primary supports during this time. CSW offered to check in with [name] weekly while the infant remains in the NICU to offer  support. [name] reported that she would like for CSW to check in with her. CSW inquired if [name] had mental health history. [name] denied mental health history. CSW asked if [name] had PPD with her older children. [name] denied PPD/anxiety.   CSW provided education regarding the baby blues period vs. perinatal mood disorders, discussed treatment and gave resources for mental health follow up if concerns arise.  CSW recommended [name] complete a self-evaluation during the postpartum time period using the New Mom Checklist from Postpartum Progress and encouraged [name] to contact a medical professional if symptoms are noted at any time.  [name] expressed appreciation for the resources. [name] reported that she felt comfortable reaching out to her doctor if concerns arise. CSW assessed [name] for safety. [name] denied SI/HI.   CSW acknowledged infant suspected trisomy 21 diagnosis. CSW discussed SSI and provided [name] with resources to follow up with SSI. [name] reported that she was familiar with the process. CSW inquired if [name] had essential items to care for the infants. [name] reported that she had essential items to care for the infant. [name] reported that she had chosen [facility] for the infant's care.  CSW identifies no further need for intervention and no barriers to discharge at this time.    CSW Plan/Description:  Perinatal Mood and Anxiety Disorder (PMADs) Education, No Further Intervention Required/No Barriers to Discharge, Supplemental Security Income (SSI) Information    Clearance Coots, LCSW 02/21/2023, 4:53 PM

## 2023-02-21 NOTE — Lactation Note (Addendum)
 This note was copied from a baby's chart.  NICU Lactation Consultation Note  Patient Name: Diana Flynn [id number] Date: 02/21/2023 Age:44 hours  Reason for consult: Initial assessment; NICU baby; Early term 37-38.6wks; Other (Comment); Infant < 6lbs (Trisomy 21, SGA, cHTN, AMA)  SUBJECTIVE Visited with family of 67 58/60 weeks old NICU female; baby "Diana Flynn" got admitted due to respiratory distress. Diana Flynn is a P3 and reported she's been pumping consistently yesterday and this morning but slowed down on her pumping this afternoon. She was overwhelmed and got emotional because she wasn't able to visit baby "Diana Flynn" in the NICU. MBU RN Diana Flynn offered to take the other twin to the nursery so she can visit baby "Diana Flynn" in the NICU; provided emotional support. She's not eligible for a Stork pump but voiced that her insurance will cover one, she just needs to call them.   OBJECTIVE Infant data: Mother's Current Feeding Choice: Breast Milk and Formula  O2 Device: HHFNC O2 Flow Rate (L/min): 2 L/min FiO2 (%): 21 %  Infant feeding assessment IDFTS - Readiness: 3   Maternal data: [id number] Vaginal, Spontaneous Significant Breast History:: moderate breast changes during the pregnancy Current breast feeding challenges:: NICU admission Pumping frequency: 4-6 times/24 hours Pumped volume: 0 mL (droplets) Flange Size: 21 Risk factor for low/delayed milk supply:: BMI of 35.8, PPH of 500 cc, AMA, SGA, < 6 lbs  WIC Program: Yes WIC Referral Sent?: Yes What county?: [place] Pump: Advised to call insurance company  ASSESSMENT Infant: Feeding Status: Scheduled 8-11-2-5 Feeding method: Tube/Gavage (Bolus) Nipple Type: Slow - flow  Maternal: Milk volume: Normal  INTERVENTIONS/PLAN Interventions: Interventions: Breast feeding basics reviewed; DEBP; Education Tools: Pump; Flanges Pump Education: Setup, frequency, and cleaning; Milk Storage  Plan: Pump both breasts on initiation mode every 3  hours for 15 minutes at a time; ideally 8 pumping sessions/24 hours STS whenever able to She'll call for latch assistance when baby "Diana Flynn" is ready to go to breast  No other support person present at this time. All questions and concerns answered, family to contact [facility] services PRN.  Consult Status: NICU follow-up NICU Follow-up type: Maternal D/C visit   Diana Flynn Diana Flynn 02/21/2023, 5:27 PM

## 2023-02-21 NOTE — Progress Notes (Addendum)
 Post Partum Day 1 Subjective: Patient reports abdominal cramping pain today. Feels tired/weak when walking around. Voiding, tolerating PO. Minimal lochia. No headache, vision changes, RUQ pain.   Objective: Patient Vitals for the past 24 hrs:  BP Temp Temp src Pulse Resp SpO2  02/21/23 0600 (!) 155/90 -- -- 87 -- --  02/21/23 0415 (!) 152/98 97.8 F (36.6 C) Oral 97 18 100 %  02/21/23 0059 (!) 152/95 -- -- 84 -- --  02/20/23 2330 (!) 152/94 98 F (36.7 C) Oral 99 18 100 %  02/20/23 2113 (!) 161/100 -- -- 96 18 --  02/20/23 2023 (!) 144/95 (!) 97 F (36.1 C) Oral 92 18 97 %  02/20/23 1633 (!) 156/101 97.6 F (36.4 C) Oral 86 17 100 %  02/20/23 1528 (!) 156/101 97.7 F (36.5 C) Oral 84 17 --  02/20/23 1501 (!) 146/99 -- -- 84 -- --  02/20/23 1446 (!) 147/101 -- -- 87 16 --  02/20/23 1431 (!) 157/97 -- -- 80 -- --  02/20/23 1416 (!) 145/109 -- -- 90 14 --  02/20/23 1407 (!) 150/101 98.7 F (37.1 C) Oral 94 16 --  02/20/23 1331 (!) 140/104 -- -- (!) 110 -- --  02/20/23 1328 (!) 151/92 -- -- (!) 103 -- --  02/20/23 1301 (!) 135/100 -- -- (!) 108 16 --  02/20/23 1231 (!) 138/101 -- -- (!) 101 -- --  02/20/23 1201 (!) 143/93 98 F (36.7 C) Oral (!) 103 16 --  02/20/23 1131 (!) 142/99 -- -- (!) 101 14 --  02/20/23 1051 (!) 147/103 -- -- (!) 110 18 --  02/20/23 1046 (!) 144/97 -- -- 96 14 --  02/20/23 1041 (!) 146/99 -- -- (!) 107 16 --  02/20/23 1036 (!) 146/100 -- -- (!) 117 -- --  02/20/23 1031 (!) 147/107 98.8 F (37.1 C) Oral (!) 111 16 --  02/20/23 1026 (!) 156/100 -- -- 98 14 --  02/20/23 1020 (!) 149/92 -- -- 94 16 100 %    Physical Exam:  General: alert, cooperative, and no distress Lochia: appropriate Uterine Fundus: firm DVT Evaluation: No evidence of DVT seen on physical exam.  Recent Labs    02/20/23 0600 02/20/23 1445 02/21/23 0511  WBC 6.0 6.7 7.6  HGB 9.9* 9.8* 8.7*  HCT 32.1* 32.6* 28.4*  PLT 227 220 202    Recent Labs    02/20/23 0600  02/21/23 0511  NA 140 137  K 3.2* 3.1*  CL 108 106  BUN <5* <5*  CREATININE 0.65 0.62  GLUCOSE 69* 87  BILITOT 0.6 0.6  ALT 16 15  AST 25 26  ALKPHOS 154* 123  PROT 6.4* 5.5*  ALBUMIN 2.5* 2.2*    Recent Labs    02/20/23 0600 02/21/23 0511  CALCIUM 8.9 8.2*    No results for input(s): "PROTIME", "APTT", "INR" in the last 72 hours.  No results for input(s): "PROTIME", "APTT", "INR", "FIBRINOGEN" in the last 72 hours. Assessment/Plan: Diana Flynn 43 y.o. [id number] PPD#1 sp SVD twins 1. PPC: routine PP care 2. Acute blood loss on anemia of pregnancy, clinically significant: hgb 8.7 this AM (from 9.8). Feeling symptomatic today. Discussed PO vs IV iron, she prefers IV iron, ordered.  3. CHTN: blood pressures poorly controlled since admission on amlodipine 5mg , increased to 10mg  this AM. No preeclampsia symptoms, labs normal. Titrate antihypertensives PRN.  4. Hypokalemia, K 3.1: potassium chloride tablet and encourage increased PO intake. Repeat tomorrow.  5. Desires neonatal circumcision, R/B/A  of procedure discussed at length. Pt understands that neonatal circumcision is not considered medically necessary and is elective. The risks include, but are not limited to bleeding, infection, damage to the penis, development of scar tissue, and having to have it redone at a later date. Pt understands theses risks and wishes to proceed.  6. Dispo: pending BP control   LOS: 1 day   Diana Flynn 02/21/2023, 8:40 AM    ADDENDUM: received call from nursery, pediatrics team requesting to delay circumcision until tomorrow.   Alinda Deem MD 02/21/23 8:50 AM

## 2023-02-21 NOTE — Progress Notes (Signed)
 Called Dr Reina Fuse again.  BP is below severe range x 2 readings 1 hour apart.  Pain score is 7, patient has had tylenol + ibuprofen with no relief 1 hour later.  Per Dr Reina Fuse added oxycodone 5mg  PO q6 prn.  No changes to BP monitoring.

## 2023-02-21 NOTE — Anesthesia Postprocedure Evaluation (Signed)
 Anesthesia Post Note  Patient: Diana Flynn  Procedure(s) Performed: AN AD HOC LABOR EPIDURAL     Patient location during evaluation: Mother Baby Anesthesia Type: Epidural Level of consciousness: awake, awake and alert, oriented and sedated Pain management: satisfactory to patient Vital Signs Assessment: post-procedure vital signs reviewed and stable Respiratory status: spontaneous breathing, nonlabored ventilation and respiratory function stable Anesthetic complications: no   No notable events documented.  Last Vitals:  Vitals:   02/21/23 0415 02/21/23 0600  BP: (!) 152/98 (!) 155/90  Pulse: 97 87  Resp: 18   Temp: 36.6 C   SpO2: 100%     Last Pain:  Vitals:   02/21/23 0634  TempSrc:   PainSc: 2    Pain Goal:                Epidural/Spinal Function Cutaneous sensation: Normal sensation (02/21/23 0835), Patient able to flex knees: Yes (02/21/23 0835), Patient able to lift hips off bed: Yes (02/21/23 0835), Back pain beyond tenderness at insertion site: No (02/21/23 0835), Progressively worsening motor and/or sensory loss: No (02/21/23 0835)  Klaryssa Fauth

## 2023-02-22 LAB — CBC
HCT: 28.4 % — ABNORMAL LOW (ref 36.0–46.0)
Hemoglobin: 8.8 g/dL — ABNORMAL LOW (ref 12.0–15.0)
MCH: 20.7 pg — ABNORMAL LOW (ref 26.0–34.0)
MCHC: 31 g/dL (ref 30.0–36.0)
MCV: 66.7 fL — ABNORMAL LOW (ref 80.0–100.0)
Platelets: 239 10*3/uL (ref 150–400)
RBC: 4.26 MIL/uL (ref 3.87–5.11)
RDW: 16.8 % — ABNORMAL HIGH (ref 11.5–15.5)
WBC: 6.3 10*3/uL (ref 4.0–10.5)
nRBC: 0 % (ref 0.0–0.2)

## 2023-02-22 LAB — COMPREHENSIVE METABOLIC PANEL
ALT: 17 U/L (ref 0–44)
AST: 31 U/L (ref 15–41)
Albumin: 2.3 g/dL — ABNORMAL LOW (ref 3.5–5.0)
Alkaline Phosphatase: 112 U/L (ref 38–126)
Anion gap: 10 (ref 5–15)
BUN: <5 mg/dL — ABNORMAL LOW (ref 6–20)
CO2: 21 mmol/L — ABNORMAL LOW (ref 22–32)
Calcium: 8.8 mg/dL — ABNORMAL LOW (ref 8.9–10.3)
Chloride: 105 mmol/L (ref 98–111)
Creatinine, Ser: 0.77 mg/dL (ref 0.44–1.00)
GFR, Estimated: >60 mL/min (ref >60)
Glucose, Bld: 83 mg/dL (ref 70–99)
Potassium: 3.2 mmol/L — ABNORMAL LOW (ref 3.5–5.1)
Sodium: 136 mmol/L (ref 135–145)
Total Bilirubin: 0.3 mg/dL (ref 0.0–1.2)
Total Protein: 6 g/dL — ABNORMAL LOW (ref 6.5–8.1)

## 2023-02-22 LAB — SURGICAL PATHOLOGY

## 2023-02-22 MED ORDER — LABETALOL HCL 200 MG PO TABS
400.0000 mg | ORAL_TABLET | Freq: Two times a day (BID) | ORAL | Status: DC
  Administered 2023-02-22 – 2023-02-23 (×3): 400 mg via ORAL
  Filled 2023-02-22 (×3): qty 2

## 2023-02-22 NOTE — Lactation Note (Signed)
 This note was copied from a baby's chart. Lactation Consultation Note  Patient Name: Diana Flynn ZHYQM'V Date: 02/22/2023 Age:44 hours Reason for consult: Follow-up assessment;Early term 37-38.6wks;Multiple gestation;Infant < 6lbs;Other (Comment);Maternal discharge (AMA, SGA, cHTN)  Visited with family of 36 hours old ETI NICU female; [name] reported she hasn't pumped the last 24 hours because she has been taking baby "[name]" to breast (no breastfeeding documented on flowsheets though). She has also been supplementing with Similac 22 calorie formula every 3 hours. [name] is scheduled to getting discharged today. Reviewed discharge education, pump settings, pumping log and the importance of consistent pumping after feedings/attempts for the onset of lactogenesis II and the prevention of engorgement. Explained that babies are still too small to stimulate the breast the same way the DEBP does. WIC referral sent yesterday, offered a [facility] loaner pump in case she gets discharged today; she'll let her RN know to page lactation in case she decides on the loaner pump. Encouraged to continue putting baby to breast and to pump afterwards every 3 hours. Will continue supplementation with Similac 22 calorie formula according to LPI policy. [name] present. All questions and concerns answered, family to contact [facility] services PRN.  Feeding Mother's Current Feeding Choice: Breast Milk and Formula  Lactation Tools Discussed/Used Tools: Pump;Flanges Flange Size: 21 Breast pump type: Double-Electric Breast Pump Pump Education: Setup, frequency, and cleaning;Milk Storage Reason for Pumping: ETI twins, baby "A" in NICU Pumping frequency: has not has the chance to pump in the last 24 hours Pumped volume: 0 mL  Interventions Interventions: Breast feeding basics reviewed;DEBP;Education;LPT handout/interventions  Discharge Discharge Education: Engorgement and breast care;Warning signs for feeding  baby;Outpatient recommendation Pump: Advised to call insurance company  Consult Status Consult Status: Complete Date: 02/22/23 Follow-up type: In-patient   Diana Flynn 02/22/2023, 11:11 AM

## 2023-02-22 NOTE — Progress Notes (Addendum)
 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 rate 18, height 5\' 5"  (1.651 m), weight 97.8 kg, last menstrual period 03/22/2022, SpO2 100%, unknown if currently breastfeeding.  Physical Exam:  General: alert, cooperative, and appears stated age 43: appropriate Uterine Fundus: firm DVT Evaluation: No evidence of DVT seen on physical exam.  Recent Labs    02/21/23 0511 02/22/23 0604  HGB 8.7* 8.8*  HCT 28.4* 28.4*   Results for orders placed or performed during the hospital encounter of 02/20/23 (from the past 24 hours)  CBC     Status: Abnormal   Collection Time: 02/22/23  6:04 AM  Result Value Ref Range   WBC 6.3 4.0 - 10.5 K/uL   RBC 4.26 3.87 - 5.11 MIL/uL   Hemoglobin 8.8 (L) 12.0 - 15.0 g/dL   HCT 16.1 (L) 09.6 - 04.5 %   MCV 66.7 (L) 80.0 - 100.0 fL   MCH 20.7 (L) 26.0 - 34.0 pg   MCHC 31.0 30.0 - 36.0 g/dL   RDW 40.9 (H) 81.1 - 91.4 %   Platelets 239 150 - 400 K/uL   nRBC 0.0 0.0 - 0.2 %  Comprehensive metabolic panel     Status: Abnormal   Collection Time: 02/22/23  6:04 AM  Result Value Ref Range   Sodium 136 135 - 145 mmol/L   Potassium 3.2 (L) 3.5 - 5.1 mmol/L   Chloride 105 98 - 111 mmol/L   CO2 21 (L) 22 - 32 mmol/L   Glucose, Bld 83 70 - 99 mg/dL   BUN <5 (L) 6 - 20 mg/dL   Creatinine, Ser 7.82 0.44 - 1.00 mg/dL   Calcium 8.8 (L) 8.9 - 10.3 mg/dL   Total Protein 6.0 (L) 6.5 - 8.1 g/dL   Albumin 2.3 (L) 3.5 - 5.0 g/dL   AST 31 15 - 41 U/L   ALT 17 0 - 44 U/L   Alkaline Phosphatase 112 38 - 126 U/L   Total Bilirubin 0.3 0.0 - 1.2 mg/dL   GFR, Estimated >95 >62 mL/min   Anion gap 10 5 - 15     Assessment/Plan: - Routine PP care - Desires neonatal circumcision, R/B/A of procedure discussed at length. Pt understands that neonatal circumcision is not considered  medically necessary and is elective. The risks include, but are not limited to bleeding, infection, damage to the penis, development of scar tissue, and having to have it redone at a later date. Pt understands theses risks and wishes to proceed -Chronic hypertension: Patient was on amlodipine 5 mg during her pregnancy.  Since delivery blood pressures have been elevated.  Amlodipine was increased to 10 mg.  However, blood pressure still 140s to 150s over 90s to 100s.  Labetalol was added and increased to 400 mg twice daily today.  Given continued elevated blood pressures will need at least 1 additional day inpatient.    LOS: 2 days   Waynard Reeds, MD 02/22/2023, 11:09 AM

## 2023-02-22 NOTE — Lactation Note (Signed)
 This note was copied from a baby's chart.  NICU Lactation Consultation Note  Patient Name: Diana Flynn [id number]'[id number] Date: 02/22/2023 Age:44 hours  Reason for consult: Follow-up assessment; NICU baby; Early term 69-38.6wks; Other (Comment); Infant < 6lbs; Maternal discharge; RN request; Mother's request; Breastfeeding assistance; Multiple gestation (AMA, SGA, cHTN)  SUBJECTIVE Visited with family of 58 hours old ETI NICU female for the 11 am feeding assist. Ms. Wassenaar and baby "[name]" doing STS when entered the room. Baby didn't wake up to for this feeding, NICU RN Leavy Cella voiced that she barely woke up for her cares and was very sleepy. Asked Ms. Smithey to call out for assistance for the next feeding in case "[name]" wakes up but she voiced that she has to go upstairs to feed her other baby "[name]" at 2 pm also, they are on the same schedule. Ms. Weinheimer reported she hasn't pumped the last 24 hours because she has been taking baby "[name]" to breast; she is scheduled to getting discharged today. Reviewed discharge education, pump settings, pumping log and the importance of consistent pumping after feedings/attempts for the onset of lactogenesis II and the prevention of engorgement. Explained that babies are still too small to stimulate the breast the same way the DEBP does. WIC referral sent yesterday, offered a [facility] loaner pump in case she gets discharged today; she'll let her RN know to page lactation in case she decides on the loaner pump or another feeding assist.   OBJECTIVE Infant data: Mother's Current Feeding Choice: Breast Milk and Formula  O2 Device: HHFNC O2 Flow Rate (L/min): 2 L/min FiO2 (%): 21 %  Infant feeding assessment IDFTS - Readiness: 4 (sleeping) IDFTS - Quality: 5 (desaturation with need for increase of oxygen, feeding stopped, gavaged rest of feed)   Maternal data: [id number] Vaginal, Spontaneous Significant Breast History:: moderate breast changes during the  pregnancy Current breast feeding challenges:: NICU admission Pumping frequency: has not had the chance to pump in the last 24 hours Pumped volume: 0 mL Flange Size: 21 Risk factor for low/delayed milk supply:: BMI of 35.8, PPH of 500 cc, AMA, SGA, < 6 lbs  WIC Program: Yes WIC Referral Sent?: Yes What county?: [place] Pump: Advised to call insurance company  ASSESSMENT Infant: Feeding Status: Scheduled 8-11-2-5 Feeding method: Tube/Gavage (Bolus)  Maternal: Milk volume: Normal  INTERVENTIONS/PLAN Interventions: Interventions: Breast feeding basics reviewed; DEBP; Education; NICU Pumping Log Discharge Education: Engorgement and breast care Tools: Pump; Flanges Pump Education: Setup, frequency, and cleaning; Milk Storage  Plan: Pump both breasts on initiation mode every 3 hours for 15 minutes at a time; ideally 8 pumping sessions/24 hours She'll take all pump parts to baby's room after her discharge She'll switch her pump settings from initiate to maintain once she starts getting 20 ml of EBM STS whenever able to She'll start taking Sage to breast on feeding cues around feeding time and will call for assistance PRN   No other support person present at this time. All questions and concerns answered, family to contact [facility] services PRN.  Consult Status: NICU follow-up NICU Follow-up type: Verify DEBP issuance; Verify onset of copious milk; Verify absence of engorgement   Bonnie Roig S Jacky Hartung 02/22/2023, 12:14 PM

## 2023-02-23 MED ORDER — IBUPROFEN 800 MG PO TABS: 800.0000 mg | ORAL_TABLET | Freq: Three times a day (TID) | ORAL | 1 refills | Status: AC | PRN

## 2023-02-23 MED ORDER — AMLODIPINE BESYLATE 10 MG PO TABS: 10.0000 mg | ORAL_TABLET | Freq: Every day | ORAL | 1 refills | Status: AC

## 2023-02-23 MED ORDER — LABETALOL HCL 400 MG PO TABS: 400.0000 mg | ORAL_TABLET | Freq: Two times a day (BID) | ORAL | 1 refills | Status: AC

## 2023-02-23 NOTE — Lactation Note (Signed)
 This note was copied from a baby's chart. Lactation Consultation Note  Patient Name: Diana Flynn [id number] Date: 02/23/2023 Age:44 hours Reason for consult: Follow-up assessment;Multiple gestation;Maternal discharge;Early term 18-38.6wks;Infant < 6lbs  LC in to visit with P3 Mom of ET twin B on day of discharge from the hospital. Baby is at a 6% weight loss with good output. Baby has been breastfeeding with supplemental bottles of 22 cal formula per MD request.  Mom hasn't been consistently pumping, but did pump this am for 5 ml of colostrum.  Mom's breasts are filling and LC educated Mom to be double pumping consistently to avoid engorgement and to promote a full milk supply for 2 babies.  Mom to use ice for 20 mins on breasts if they become engorged.  [facility] provided Mom a [facility] loaner # [id number] Mom aware of pump in Baby A's room in the NICU that is available for her to pump.  Mom has labels and bottles to store her milk.   Lactation Tools Discussed/Used Tools: Pump;Flanges Flange Size: 21 Breast pump type: Double-Electric Breast Pump Pump Education: Setup, frequency, and cleaning;Milk Storage Reason for Pumping: support milk supply, ET twins <6lbs Pumping frequency: inconsistent pumping, encouraged pumping after every feeding at the breast or when baby is supplemented Pumped volume: 5 mL  Interventions Interventions: Education;DEBP  Discharge Discharge Education: Engorgement and breast care;Outpatient recommendation;Outpatient Epic message sent Pump: [facility] Loaner [facility] Program: Yes  Consult Status Consult Status: Complete Date: 02/23/23 Follow-up type: Out-patient    Judee Clara 02/23/2023, 2:44 PM

## 2023-02-23 NOTE — Discharge Summary (Signed)
 Postpartum Discharge Summary  Date of Service updated     Patient Name: Diana Flynn DOB: Nov 13, 1980 MRN: [id number]  Date of admission: 02/20/2023 Delivery date:   Tenee, Wish [[id number]]  02/20/2023    Chrisa, Hassan [[id number]]  02/20/2023 Delivering provider:    Rondalyn, Belford [[id number]]  [facility], Patty, Leitzke [place] [[id number]]  [facility], Mickle Asper M Date of discharge: 02/23/2023  Admitting diagnosis: Dichorionic diamniotic twin pregnancy in third trimester [O30.043] Intrauterine pregnancy: [redacted]w[redacted]d     Secondary diagnosis:  Principal Problem:   Dichorionic diamniotic twin pregnancy in third trimester  Additional problems: CHTN on Rx, Baby A with T21   Discharge diagnosis: Term Pregnancy Delivered                                              Post partum procedures: none Augmentation: AROM and Pitocin Complications: None  Hospital course: Induction of Labor With Vaginal Delivery   43 y.o. yo [id number] at [redacted]w[redacted]d was admitted to the hospital 02/20/2023 for induction of labor.  Indication for induction:  CHTN on Rx in setting of di-di-TIUP with baby A suspected T21 .  Patient had an labor course complicated by none. Membrane Rupture Time/Date:    Aydee, Mcnew [[id number]]  11:01 AM    Marella Bile [place] [[id number]]  1:17 PM,   Areesha, Dehaven [[id number]]  02/20/2023    Nolie, Bignell [[id number]]  02/20/2023  Delivery Method:   Leland Johns [[id number]]  Vaginal, Spontaneous    Kelena, Garrow [place] [[id number]]  Vaginal, Spontaneous Episiotomy:    Arora, Coakley [[id number]]  None    Haniyah, Maciolek Pharr [[id number]]  None Lacerations:     Mayte, Diers [[id number]]  None    Mckenzi, Buonomo [[id number]]  None Details of delivery can be found in separate delivery note.  Patient had a postpartum course complicated by BP control. Patient was on amlodipine 5mg  every day prior to pregnancy but admitted to  inconsistent use. Was titrated up to amlodpine 10mg  plus additional 400mg  labetalol BID with improved control. Patient is discharged home 02/23/23.   Newborn Data: Birth date:   Karinne, Schmader [[id number]]  02/20/2023    Cambree, Hendrix [[id number]]  02/20/2023 Birth time:   Kattleya, Kuhnert [[id number]]  1:14 PM    Rossi, Silvestro [place] [[id number]]  1:25 PM Gender:   Nixon, Sparr [[id number]]  Female    Keidy, Thurgood [[id number]]  Female Living status:   Alie, Moudy [[id number]]  TOIZTI    WPYKDX, IPJA [place] [[id number]]  Living Apgars:   My, Rinke [[id number]]  [address] [place] [[id number]]  8 ,   Shir, Bergman [[id number]]  [address] [place] [address] [place] [[id number]]  9  Weight:   Terita, Hejl [[id number]]  2480 g    Makaylyn, Sinyard [[id number]]  2530 g   Immunizations administered: Immunization History  Administered Date(s) Administered   Influenza Split 10/31/2011   MMR 01/27/2012   Tdap 10/31/2011, 06/09/2012    Physical exam  Vitals:   02/22/23 0500 02/22/23 1344 02/22/23 2017 02/23/23 0549  BP: (!) 144/93 (!) 145/90 (!) 146/99 (!) 134/96  Pulse: 92 94 99 93  Resp: 18 18 18 18   Temp: 98.5 F (36.9 C) 98.2 F (36.8 C)  98.2 F (36.8 C) 98 F (36.7 C)  TempSrc: Oral Oral Oral Oral  SpO2: 100% 100% 100% 98%  Weight:      Height:       General: alert, cooperative, and no distress Lochia: appropriate Uterine Fundus: firm Incision: N/A DVT Evaluation: No evidence of DVT seen on physical exam. Negative Homan's sign. Labs: Lab Results  Component Value Date   WBC 6.3 02/22/2023   HGB 8.8 (L) 02/22/2023   HCT 28.4 (L) 02/22/2023   MCV 66.7 (L) 02/22/2023   PLT 239 02/22/2023      Latest Ref Rng & Units 02/22/2023    6:04 AM  CMP  Glucose 70 - 99 mg/dL 83   BUN 6 - 20 mg/dL <5   Creatinine 4.09 - 1.00 mg/dL 8.11   Sodium 914 - 782 mmol/L 136   Potassium 3.5 - 5.1 mmol/L 3.2   Chloride  98 - 111 mmol/L 105   CO2 22 - 32 mmol/L 21   Calcium 8.9 - 10.3 mg/dL 8.8   Total Protein 6.5 - 8.1 g/dL 6.0   Total Bilirubin 0.0 - 1.2 mg/dL 0.3   Alkaline Phos 38 - 126 U/L 112   AST 15 - 41 U/L 31   ALT 0 - 44 U/L 17    Edinburgh Score:    02/21/2023    8:35 AM  Edinburgh Postnatal Depression Scale Screening Tool  I have been able to laugh and see the funny side of things. 1  I have looked forward with enjoyment to things. 0  I have blamed myself unnecessarily when things went wrong. 0  I have been anxious or worried for no good reason. 2  I have felt scared or panicky for no good reason. 1  Things have been getting on top of me. 0  I have been so unhappy that I have had difficulty sleeping. 1  I have felt sad or miserable. 0  I have been so unhappy that I have been crying. 1  The thought of harming myself has occurred to me. 0  Edinburgh Postnatal Depression Scale Total 6      After visit meds:  Allergies as of 02/23/2023   No Known Allergies      Medication List     TAKE these medications    amLODipine 10 MG tablet Commonly known as: NORVASC Take 1 tablet (10 mg total) by mouth daily. Start taking on: February 24, 2023 What changed:  medication strength how much to take   ibuprofen 800 MG tablet Commonly known as: ADVIL Take 1 tablet (800 mg total) by mouth every 8 (eight) hours as needed.   Labetalol HCl 400 MG Tabs Take 400 mg by mouth 2 (two) times daily.   PrePLUS 27-1 MG Tabs Take 1 tablet by mouth daily.         Discharge home in stable condition Infant Feeding: Formula x2 Infant Disposition: Baby A in NICU, baby B home with mother Discharge instruction: per After Visit Summary and Postpartum booklet. Activity: Advance as tolerated. Pelvic rest for 6 weeks.  Diet: low salt diet Anticipated Birth Control: Unsure Postpartum Appointment:6 weeks Additional Postpartum F/U: BP check 1 week Future Appointments:No future appointments. Follow  up Visit: [facility]   02/23/2023 Carlisle Cater, MD

## 2023-02-23 NOTE — Progress Notes (Signed)
 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 Denies PreE symptoms, happy to hear about imrpoving BPs  Objective: Blood pressure (!) 134/96, pulse 93, temperature 98 F (36.7 C), temperature source Oral, resp. rate 18, height 5\' 5"  (1.651 m), weight 97.8 kg, last menstrual period 03/22/2022, SpO2 98%, unknown if currently breastfeeding.  Physical Exam:  General: alert, cooperative, and appears stated age Lochia: appropriate Uterine Fundus: firm DVT Evaluation: No evidence of DVT seen on physical exam.  Recent Labs    02/21/23 0511 02/22/23 0604  HGB 8.7* 8.8*  HCT 28.4* 28.4*   No results found for this or any previous visit (from the past 24 hours).    Assessment/Plan: - Routine PP care -Chronic hypertension: Now on amlodipine 10mg  and lab 400mg  BID. Some improvement noted. Spoke regarding PCP/cardiology care given h/o HTN in herself and family -Meeting postpartum milestone. RTO in 1wk for BP and mood check   LOS: 3 days   Carlisle Cater, MD 02/23/2023, 9:36 AM

## 2023-02-23 NOTE — Lactation Note (Addendum)
 This note was copied from a baby's chart.  NICU Lactation Consultation Note  Patient Name: Diana Flynn [id number] Date: 02/23/2023 Age:43 years  Reason for consult: Follow-up assessment; NICU baby; Early term 17-38.6wks; Infant < 6lbs; Multiple gestation; Maternal discharge  SUBJECTIVE  LC in to visit with P3 Mom of ET twins "Diana Flynn" baby A is in the NICU for respiratory support.   Mom pumped this morning, and LC found the pump parts hanging on the pump and noted EBM in the bottle.  Mom stated that she had pumped 2 hrs prior.  LC collected the EBM and had Mom label the bottle.  LC took the EBM to baby's room in NICU.    LC disassembled all the pump parts, saw that the colostrum protector was still in place (only to use 24 hrs) and discarded this.  LC set up a washing and a drying bins, labeled and instructed Mom to transport all pumping supplies to NICU.  Mom provided with [facility] paperwork and instructed on $30 refundable deposit needed.  Mom will let her MBU RN know when she has the $ or may go home and return and will ask baby's RN to call LC before 5:30 pm.  Mom encouraged to pump consistently.  Mom's breasts are filling, her right breast has a hard area around the outer edge.  Mom aware of engorgement prevention and treatment, which was reviewed.  Mom has been breastfeeding twin B occasionally but baby has had many bottle of 24 cal formula.    Plan recommended- 1- STS with baby 2- Pump both breasts every 3 hrs to support a full milk supply, using DEBP [facility] loaner offered) 3- ask for assistance from [facility] prn  OBJECTIVE Infant data: Mother's Current Feeding Choice: Breast Milk and Formula  O2 Device: HHFNC O2 Flow Rate (L/min): 2 L/min FiO2 (%): 21 %  Infant feeding assessment IDFTS - Readiness: 2 IDFTS - Quality: 3   Maternal data: [id number] Vaginal, Spontaneous Pumping frequency: Not consistently Pumped volume: 5 mL Flange Size: 21  WIC Program: Yes WIC Referral Sent?:  Yes What county?: [place] Pump:  (Offered a WIC loaner pump, paperwork provided)  ASSESSMENT Infant:  Feeding Status: Scheduled 8-11-2-5 Feeding method: Bottle; Tube/Gavage (Bolus) Nipple Type: Nfant Extra Slow Flow (gold)  Maternal: Milk volume: Normal  INTERVENTIONS/PLAN Interventions: Interventions: Skin to skin; Breast massage; Hand express; DEBP; Education Discharge Education: Engorgement and breast care Tools: Pump; Flanges Pump Education: Setup, frequency, and cleaning; Milk Storage  Plan: Consult Status: NICU follow-up NICU Follow-up type: Verify onset of copious milk; Verify absence of engorgement; Verify DEBP issuance   Diana Flynn 02/23/2023, 11:37 AM

## 2023-03-02 ENCOUNTER — Telehealth (HOSPITAL_COMMUNITY): Payer: Self-pay | Admitting: *Deleted

## 2023-03-02 NOTE — Telephone Encounter (Signed)
 03/02/2023  Name: Diana Flynn MRN: [id number] DOB: [birth year]  Reason for Call:  Transition of Care Hospital Discharge Call  Contact Status: Patient Contact Status: Complete  Language assistant needed: Interpreter Mode: Interpreter Not Needed        Follow-Up Questions: Do You Have Any Concerns About Your Health As You Heal From Delivery?: No Do You Have Any Concerns About Your Infants Health?: No (one baby at home, one in NICU)  [place] Postnatal Depression Scale:  In the Past 7 Days: I have been able to laugh and see the funny side of things.: Not quite so much now I have looked forward with enjoyment to things.: As much as I ever did I have blamed myself unnecessarily when things went wrong.: Not very often I have been anxious or worried for no good reason.: Yes, sometimes I have felt scared or panicky for no good reason.: No, not much Things have been getting on top of me.: No, most of the time I have coped quite well I have been so unhappy that I have had difficulty sleeping.: Not very often I have felt sad or miserable.: Not very often I have been so unhappy that I have been crying.: Only occasionally The thought of harming myself has occurred to me.: Never Edinburgh Postnatal Depression Scale Total: 9  PHQ2-9 Depression Scale:     Discharge Follow-up: Edinburgh score requires follow up?: No Patient was advised of the following resources:: Support Group, Breastfeeding Support Group  Post-discharge interventions: Reviewed Newborn Safe Sleep Practices  Salena Saner, RN 03/02/2023 12:02

## 2023-04-30 DIAGNOSIS — Z3202 Encounter for pregnancy test, result negative: Secondary | ICD-10-CM | POA: Diagnosis not present

## 2023-04-30 DIAGNOSIS — Z30011 Encounter for initial prescription of contraceptive pills: Secondary | ICD-10-CM | POA: Diagnosis not present

## 2023-04-30 DIAGNOSIS — I10 Essential (primary) hypertension: Secondary | ICD-10-CM | POA: Diagnosis not present

## 2023-04-30 DIAGNOSIS — Z1389 Encounter for screening for other disorder: Secondary | ICD-10-CM | POA: Diagnosis not present

## 2023-07-03 DIAGNOSIS — I10 Essential (primary) hypertension: Secondary | ICD-10-CM | POA: Diagnosis not present

## 2023-09-12 DIAGNOSIS — Z01419 Encounter for gynecological examination (general) (routine) without abnormal findings: Secondary | ICD-10-CM | POA: Diagnosis not present

## 2023-09-12 DIAGNOSIS — Z130 Encounter for screening for diseases of the blood and blood-forming organs and certain disorders involving the immune mechanism: Secondary | ICD-10-CM | POA: Diagnosis not present

## 2023-09-12 DIAGNOSIS — Z1389 Encounter for screening for other disorder: Secondary | ICD-10-CM | POA: Diagnosis not present

## 2023-09-12 DIAGNOSIS — I10 Essential (primary) hypertension: Secondary | ICD-10-CM | POA: Diagnosis not present

## 2023-09-12 DIAGNOSIS — Z1231 Encounter for screening mammogram for malignant neoplasm of breast: Secondary | ICD-10-CM | POA: Diagnosis not present

## 2023-09-25 ENCOUNTER — Encounter

## 2023-10-01 DIAGNOSIS — N760 Acute vaginitis: Secondary | ICD-10-CM | POA: Diagnosis not present

## 2023-10-01 DIAGNOSIS — Z113 Encounter for screening for infections with a predominantly sexual mode of transmission: Secondary | ICD-10-CM | POA: Diagnosis not present
